# Patient Record
Sex: Female | Born: 1960 | Race: Black or African American | Hispanic: No | Marital: Married | Smoking: Never smoker
Health system: Southern US, Community
[De-identification: ages and names within clinical notes are randomized; demographics above are authoritative.]

## PROBLEM LIST (undated history)

## (undated) DIAGNOSIS — Z923 Personal history of irradiation: Secondary | ICD-10-CM

## (undated) DIAGNOSIS — C801 Malignant (primary) neoplasm, unspecified: Secondary | ICD-10-CM

## (undated) DIAGNOSIS — I10 Essential (primary) hypertension: Secondary | ICD-10-CM

## (undated) DIAGNOSIS — Z803 Family history of malignant neoplasm of breast: Secondary | ICD-10-CM

## (undated) DIAGNOSIS — Z1379 Encounter for other screening for genetic and chromosomal anomalies: Principal | ICD-10-CM

## (undated) HISTORY — PX: BREAST EXCISIONAL BIOPSY: SUR124

## (undated) HISTORY — DX: Encounter for other screening for genetic and chromosomal anomalies: Z13.79

## (undated) HISTORY — DX: Family history of malignant neoplasm of breast: Z80.3

## (undated) HISTORY — PX: BREAST LUMPECTOMY: SHX2

## (undated) HISTORY — PX: ABDOMINAL HYSTERECTOMY: SHX81

---

## 1997-07-27 ENCOUNTER — Ambulatory Visit (HOSPITAL_COMMUNITY): Admission: RE | Admit: 1997-07-27 | Discharge: 1997-07-27 | Payer: Self-pay | Admitting: *Deleted

## 1998-01-26 ENCOUNTER — Ambulatory Visit (HOSPITAL_COMMUNITY): Admission: RE | Admit: 1998-01-26 | Discharge: 1998-01-26 | Payer: Self-pay | Admitting: *Deleted

## 1998-02-20 ENCOUNTER — Other Ambulatory Visit: Admission: RE | Admit: 1998-02-20 | Discharge: 1998-02-20 | Payer: Self-pay | Admitting: Obstetrics and Gynecology

## 1998-07-27 ENCOUNTER — Ambulatory Visit (HOSPITAL_COMMUNITY): Admission: RE | Admit: 1998-07-27 | Discharge: 1998-07-27 | Payer: Self-pay | Admitting: *Deleted

## 1999-01-24 ENCOUNTER — Ambulatory Visit (HOSPITAL_COMMUNITY): Admission: RE | Admit: 1999-01-24 | Discharge: 1999-01-24 | Payer: Self-pay | Admitting: *Deleted

## 1999-02-27 ENCOUNTER — Other Ambulatory Visit: Admission: RE | Admit: 1999-02-27 | Discharge: 1999-02-27 | Payer: Self-pay | Admitting: Obstetrics and Gynecology

## 2000-08-04 ENCOUNTER — Encounter: Payer: Self-pay | Admitting: Obstetrics and Gynecology

## 2000-08-04 ENCOUNTER — Encounter: Admission: RE | Admit: 2000-08-04 | Discharge: 2000-08-04 | Payer: Self-pay | Admitting: Obstetrics and Gynecology

## 2001-07-22 ENCOUNTER — Other Ambulatory Visit: Admission: RE | Admit: 2001-07-22 | Discharge: 2001-07-22 | Payer: Self-pay | Admitting: Obstetrics and Gynecology

## 2001-08-26 ENCOUNTER — Encounter: Payer: Self-pay | Admitting: Obstetrics and Gynecology

## 2001-08-26 ENCOUNTER — Encounter: Admission: RE | Admit: 2001-08-26 | Discharge: 2001-08-26 | Payer: Self-pay | Admitting: Obstetrics and Gynecology

## 2001-09-07 ENCOUNTER — Encounter (INDEPENDENT_AMBULATORY_CARE_PROVIDER_SITE_OTHER): Payer: Self-pay | Admitting: Specialist

## 2001-09-07 ENCOUNTER — Ambulatory Visit (HOSPITAL_COMMUNITY): Admission: RE | Admit: 2001-09-07 | Discharge: 2001-09-07 | Payer: Self-pay | Admitting: Obstetrics and Gynecology

## 2002-08-30 ENCOUNTER — Encounter: Payer: Self-pay | Admitting: Obstetrics and Gynecology

## 2002-08-30 ENCOUNTER — Encounter: Admission: RE | Admit: 2002-08-30 | Discharge: 2002-08-30 | Payer: Self-pay | Admitting: Obstetrics and Gynecology

## 2002-09-07 ENCOUNTER — Other Ambulatory Visit: Admission: RE | Admit: 2002-09-07 | Discharge: 2002-09-07 | Payer: Self-pay | Admitting: Obstetrics and Gynecology

## 2003-08-31 ENCOUNTER — Encounter: Admission: RE | Admit: 2003-08-31 | Discharge: 2003-08-31 | Payer: Self-pay | Admitting: Family Medicine

## 2003-12-12 ENCOUNTER — Other Ambulatory Visit: Admission: RE | Admit: 2003-12-12 | Discharge: 2003-12-12 | Payer: Self-pay | Admitting: Obstetrics and Gynecology

## 2004-09-16 ENCOUNTER — Encounter: Admission: RE | Admit: 2004-09-16 | Discharge: 2004-09-16 | Payer: Self-pay | Admitting: Obstetrics and Gynecology

## 2005-01-16 ENCOUNTER — Other Ambulatory Visit: Admission: RE | Admit: 2005-01-16 | Discharge: 2005-01-16 | Payer: Self-pay | Admitting: Obstetrics and Gynecology

## 2005-09-18 ENCOUNTER — Encounter: Admission: RE | Admit: 2005-09-18 | Discharge: 2005-09-18 | Payer: Self-pay | Admitting: Obstetrics and Gynecology

## 2006-01-20 ENCOUNTER — Ambulatory Visit (HOSPITAL_COMMUNITY): Admission: RE | Admit: 2006-01-20 | Discharge: 2006-01-21 | Payer: Self-pay | Admitting: Obstetrics and Gynecology

## 2006-01-20 ENCOUNTER — Encounter (INDEPENDENT_AMBULATORY_CARE_PROVIDER_SITE_OTHER): Payer: Self-pay | Admitting: *Deleted

## 2006-10-01 ENCOUNTER — Encounter: Admission: RE | Admit: 2006-10-01 | Discharge: 2006-10-01 | Payer: Self-pay | Admitting: Family Medicine

## 2007-10-04 ENCOUNTER — Encounter: Admission: RE | Admit: 2007-10-04 | Discharge: 2007-10-04 | Payer: Self-pay | Admitting: Family Medicine

## 2008-10-04 ENCOUNTER — Encounter: Admission: RE | Admit: 2008-10-04 | Discharge: 2008-10-04 | Payer: Self-pay | Admitting: Obstetrics and Gynecology

## 2008-10-13 ENCOUNTER — Encounter: Admission: RE | Admit: 2008-10-13 | Discharge: 2008-10-13 | Payer: Self-pay | Admitting: Orthopedic Surgery

## 2009-10-05 ENCOUNTER — Encounter: Admission: RE | Admit: 2009-10-05 | Discharge: 2009-10-05 | Payer: Self-pay | Admitting: Family Medicine

## 2010-09-05 ENCOUNTER — Other Ambulatory Visit: Payer: Self-pay | Admitting: Family Medicine

## 2010-09-05 DIAGNOSIS — Z853 Personal history of malignant neoplasm of breast: Secondary | ICD-10-CM

## 2010-09-05 DIAGNOSIS — Z1231 Encounter for screening mammogram for malignant neoplasm of breast: Secondary | ICD-10-CM

## 2010-09-20 NOTE — H&P (Signed)
Destiny Barry, Destiny Barry             ACCOUNT NO.:  [id number]   MEDICAL RECORD NO.:  [id number]          PATIENT TYPE:  AMB   LOCATION:                                [facility]   PHYSICIAN:  [name], M.D.DATE OF BIRTH:  10-30-1960   DATE OF ADMISSION:  01/20/2006  DATE OF DISCHARGE:                                HISTORY & PHYSICAL   CHIEF COMPLAINT:  Symptomatic fibroids.   HISTORY OF PRESENT ILLNESS:  A 50 year old G2, P0, A2, currently using  condoms for contraceptions.  This patient has had increasing problems with  cramping and bleeding secondary to leiomyoma.  Presents now for definitive  hysterectomy.  She underwent D&C with hysteroscopy in 2003.  A submucous  fibroid was noted but was only partially resected.  She has continued to  contemplate whether or not to consider other treatment options.  It is not  controlled well with hormonal treatment.  Now has decided that she wants  definitive treatment in the form of hysterectomy.   Her most recent ultrasound in our office October 24, 2005, showed two fibroids,  5.3 and 4.1, adnexa unremarkable.  On attempts at saline instillation we did  not get good filling, but one of the fibroids appeared to be submucosal.  We  discussed treatment options.  She has had no history of abnormal Paps.  Last  Pap August 2006 was normal.  She would prefer to get back to work ASAP,  which is why we discussed the possibility of LSH.  She would also prefer to  leave her otherwise normal-appearing ovaries intact, which we discussed.  I  did review that if the fibroid obscure technically the ability to secure the  ascending branch of the uterine artery, she may require hysterectomy via  standard approach with a TAH, which she stands.   This procedure, including risks of bleeding, infection, adjacent organ  injury, transfusion, wound infection, phlebitis, along with her expected  recovery time, were all reviewed.  Again, her preference is  to leave her  otherwise normal-appearing ovaries unless pathology is encountered.   PAST MEDICAL HISTORY:  Allergies:  None.   Current medications:  Detrol LA.   REVIEW OF SYSTEMS:  Significant for smoking one-half PPD and a history of  anemia.  Otherwise negative.  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  This was by Dr. Luan Pulling in the mid-1990s.   FAMILY HISTORY:  Significant for hypertension and stroke.   PHYSICAL EXAMINATION:  VITAL SIGNS:  Temperature 98.2, blood pressure  110/80.  HEENT:  Unremarkable.  NECK:  Supple without masses.  LUNGS:  Clear.  CARDIOVASCULAR:  Regular rate and rhythm without murmurs, rubs, gallops  noted.  BREASTS:  Without masses.  ABDOMEN:  Soft, flat, nontender.  PELVIC:  Normal external genitalia.  Vagina and cervix clear.  Uterus  midposition, upper limit of normal size.  Adnexa negative.  Exam was  difficult due to her weight of about 250 pounds.  EXTREMITIES:  Unremarkable.  NEUROLOGIC:  Unremarkable.   IMPRESSION:  Symptomatic leiomyoma.   PLAN:  LSH, possible TAH.  Procedure and risks reviewed as above.      Richard M. Marcelle Overlie, [name]  Electronically Signed     RMH/MEDQ  D:  01/15/2006  T:  01/15/2006  Job:  [id number]

## 2010-09-20 NOTE — Op Note (Signed)
[facility]  Patient:    Destiny Barry, Destiny Barry Visit Number: [id number] MRN: [id number]          Service Type: DSU Location: [facility] Attending Physician:  Rhina Brackett Dictated by:   [name], [name] Proc. Date: 09/07/01 Admit Date:  09/07/2001                             Operative Report  PREOPERATIVE DIAGNOSES:       Abnormal uterine bleeding, leiomyoma.  POSTOPERATIVE DIAGNOSES:      Abnormal uterine bleeding, leiomyoma.  PROCEDURE:                    Dilatation and curettage, hysteroscopy.  SURGEON:                      [name], [name]  ANESTHESIA:                   General.  COMPLICATIONS:                None.  DRAINS:                       Foley catheter.  ESTIMATED BLOOD LOSS:         Less than 10 cc.  PROCEDURE AND FINDINGS:       Patient sent to the operating room.  After an adequate level of general anesthesia was obtained with the patients legs in stirrups, the perineum and vagina were prepped and draped in the usual manner for D&C.  The bladder was drained.  EUA carried out.  Uterus mid position, normal size.  Adnexa negative.  Speculum was positioned.  Cervix grasped with a tenaculum.  Was sounded to 8 cm, progressively dilated to a 29 Pratt.  Her sponge and laminaria were removed preoperatively.  The 7 mm continuous flow diagnostic hysteroscope was then inserted.  It was a difficult view due to what appeared to be distortion from intramural fibroids.  Scope was removed. D&C was carried out.  The cavity was irregularly shaped by palpation.  D&C was carried out and the scope was reinserted.  A better view the second time.  No submucous fibroids that were resectable were noted; however, there was some distortion of the cavity laterally on either side, although the fundus looked fairly normal.  After these findings were noted since resection was not possible, instruments were removed.  Several interrupted 2-0  chromic sutures placed on the cervical tenaculum site to control a small amount of bleeding. This was hemostatic.  She went to recovery room in good condition. Dictated by:   [name], [name] Attending Physician:  Rhina Brackett DD:  09/07/01 TD:  09/08/01 Job: [id number]

## 2010-09-20 NOTE — H&P (Signed)
[facility]  Patient:    Destiny Barry, Destiny Barry Visit Number: [id number] MRN: [id number]          Service Type: DSU Location: [facility] Attending Physician:  Rhina Brackett Dictated by:   [name], [name] Admit Date:  09/07/2001                           History and Physical  SCHEDULED SURGERY DATE:       Sep 07, 2001 at [facility].  CHIEF COMPLAINT:              Abnormal uterine bleeding.  HISTORY OF PRESENT ILLNESS:   This is a 50 year old, G2, P0, not currently using anything for contraception. On recent examination, sonohysterogram was done to evaluate her abnormal bleeding. Findings showed uterus 9.5 x 6 x 5.4 _______ 12.2 endometrium with several submucous appearing fibroids on scan. She presents at this time for D&C/hysteroscopy, possible resection of submucous fibroids. This procedure, including risk of bleeding, infection, transfusion, adjacent organ injury with possible need for open or additional surgery are all reviewed with her which she understands and accepts.  PAST MEDICAL HISTORY:  ALLERGIES:                    None.  OBSTETRICAL HISTORY:          TAB x 1.  MEDICAL HISTORY:              She has had early stage breast cancer with lumpectomy treated with postoperative RT.  REVIEW OF SYSTEMS:            Significant for smoking one-half ppd; otherwise negative.  PHYSICAL EXAMINATION:  VITAL SIGNS:                  Temperature 98.2, blood pressure 120/78.  HEENT:                        Unremarkable.  NECK:                         Supple without masses.  LUNGS:                        Clear.  CARDIOVASCULAR:               Regular rate and rhythm without murmurs, rubs, or gallops.  BREASTS:                      Without masses.  ABDOMEN:                      Soft, flat, nontender.  PELVIC:                       Normal external genitalia. Vagina and cervix clear. Uterus mid position, normal size. Adnexa  negative, although exam difficult due to her weight of 256.  IMPRESSION:                   Abnormal uterine bleeding, submucous fibroids noted on ultrasound.  PLAN:                         D&C/hysteroscopy with possible resection of submucous fibroids. Procedure and risks reviewed as above. Dictated by:   Duke Salvia.  Marcelle Overlie, [name] Attending Physician:  Rhina Brackett DD:  09/06/01 TD:  09/06/01 Job: [id number]

## 2010-09-20 NOTE — Op Note (Signed)
Destiny Barry, Destiny Barry             ACCOUNT NO.:  [id number]   MEDICAL RECORD NO.:  [id number]          PATIENT TYPE:  AMB   LOCATION:  SDC                           [facility]:  [facility]   PHYSICIAN:  [name], M.D.DATE OF BIRTH:  10-09-1960   DATE OF PROCEDURE:  01/20/2006  DATE OF DISCHARGE:                                 OPERATIVE REPORT   PREOPERATIVE DIAGNOSIS:  Symptomatic leiomyoma with menorrhagia.   POSTOPERATIVE DIAGNOSIS:  Symptomatic leiomyoma with menorrhagia.   PROCEDURE:  LSH.   SURGEON:  [name], [name]   ASSISTANT:  Guy Sandifer. Henderson Cloud, [name]   ANESTHESIA:  General endotracheal anesthesia.   COMPLICATIONS:  None.   DRAINS:  Foley catheter.   BLOOD LOSS:  100 mL.   PROCEDURE AND FINDINGS:  The patient was taken to the operating room and  after an adequate level of general endotracheal anesthesia was obtained with  the patient's legs in stirrups, the abdomen, perineum and vagina were  prepped and draped with Betadine.  A Foley catheter was positioned draining  clear urine.  The subumbilical area was infiltrated with 0.5% Marcaine  plain.  A small incision was made.  The Veress needle was introduced without  difficulty.  Its intra-abdominal position was verified by pressure and water  testing.  After a 3 liters pneumoperitoneum was created, the laparoscopic  trocar and sleeve were then introduced without difficulty.  An atraumatic 10  mm trocar was then placed in the left lower quadrant after negative  transillumination and infiltration of the skin with 0.5% Marcaine plain.  This was done under direct visualization.  The patient was then placed in  Trendelenburg. The anterior and posterior cul-de-sac spaces were free and  clear. The rest of the upper abdominal exam was unremarkable.  Her uterus  was 8-10 weeks size, symmetrically enlarged with known fibroids, especially  posteriorly, but there was good mobility.  The system placed the uterus on  traction and the left utero-ovarian pedicle was coagulated and divided with  the Ace harmonic scalpel down to including the round ligament.  The  peritoneal leaf was then cleared across to the midline.  The exact same  repeated on the opposite side with excellent hemostasis.  Thus, both normal  ovaries were conserved.   The anterior perineum was dissected with some minimal sharp and blunt  dissection to displace it slightly posteriorly.  The ascending branch of the  uterine artery on either side was then multiply coagulated and divided on  each side with excellent hemostasis and blanching of the fundus.  The  harmonic Ace was then used to divide the fundus.  The endocervical canal was  coagulated with the harmonic Ace on low power.  The Morcellator was then  positioned at the left lower quadrant and the specimen was morcellated.  Small pieces were removed.  Careful inspection remove all fragments were  removed.  The pelvis was irrigated with saline and inspected. The operative  site was noted to be  hemostatic.  Interceed was placed across the endocervical stump.  The  instruments were removed, gas allowed to escape.  The deep fascia was closed  with 4-0 Dexon subcuticular sutures and the left lower fascia was closed  with a 2-0 Vicryl suture.  She tolerated this well and went to the recovery  room in good condition.      Richard M. Marcelle Overlie, [name]  Electronically Signed     RMH/MEDQ  D:  01/20/2006  T:  01/20/2006  Job:  [id number]

## 2010-09-20 NOTE — Discharge Summary (Signed)
NAMEJAKAYLA, Destiny Barry             ACCOUNT NO.:  [id number]   MEDICAL RECORD NO.:  [id number]          PATIENT TYPE:  OIB   LOCATION:  9306                          [facility]ILITY:  [facility]   PHYSICIAN:  [name], M.D.DATE OF BIRTH:  25-Oct-1960   DATE OF ADMISSION:  01/20/2006  DATE OF DISCHARGE:  01/21/2006                                 DISCHARGE SUMMARY   DISCHARGE DIAGNOSES:  1. Symptomatic leiomyoma with menorrhagia.  2. Laparoscopic supracervical hysterectomy this admission.   SUMMARY OF THE HISTORY AND PHYSICAL EXAM:  Please see admission H&P for  details.  Briefly, a 50 year old with symptomatic leiomyoma presents for  [facility].   HOSPITAL COURSE:  On September 18, under general anesthesia, the patient  underwent a LSH.  Both ovaries, which were normal, were conserved.  Catheter  was removed that evening; she was voiding without difficulty the following  a.m., was tolerating a regular diet and was ambulating without difficulty,  afebrile.  The incisions were clean and dry, and she was ready for discharge  at that point.  Preop hemoglobin 10.4, postop 9.1.   OTHER LABORATORY DATA:  B positive, antibody screen negative.  CMET normal.  Admission CBC normal, except for hemoglobin of 10.4, hematocrit 32.5 and  platelets 446,000.   DISPOSITION:  The patient is discharged on Tylox p.r.n. pain, will take  Feosol OTC iron once daily.  Will return to the office in 1 week, advised to  report any incisional redness or drainage, increased pain or bleeding, or  fever over 101.  She was given specific instructions regarding diet, sex and  exercise.   CONDITION:  Good.   ACTIVITY:  Gradually increase.      Richard M. Marcelle Overlie, [name]  Electronically Signed     RMH/MEDQ  D:  01/21/2006  T:  01/22/2006  Job:  [id number]

## 2010-10-08 ENCOUNTER — Ambulatory Visit
Admission: RE | Admit: 2010-10-08 | Discharge: 2010-10-08 | Disposition: A | Discharge status: Home / Self Care | Payer: 59 | Source: Ambulatory Visit | Attending: Family Medicine | Admitting: Family Medicine

## 2010-10-08 DIAGNOSIS — Z1231 Encounter for screening mammogram for malignant neoplasm of breast: Secondary | ICD-10-CM

## 2010-10-08 DIAGNOSIS — Z853 Personal history of malignant neoplasm of breast: Secondary | ICD-10-CM

## 2010-10-24 ENCOUNTER — Other Ambulatory Visit: Payer: Self-pay | Admitting: Family Medicine

## 2010-10-24 DIAGNOSIS — Z853 Personal history of malignant neoplasm of breast: Secondary | ICD-10-CM

## 2010-11-13 ENCOUNTER — Ambulatory Visit
Admission: RE | Admit: 2010-11-13 | Discharge: 2010-11-13 | Disposition: A | Discharge status: Home / Self Care | Payer: 59 | Source: Ambulatory Visit | Attending: Family Medicine | Admitting: Family Medicine

## 2010-11-13 DIAGNOSIS — Z853 Personal history of malignant neoplasm of breast: Secondary | ICD-10-CM

## 2010-11-13 MED ORDER — GADOBENATE DIMEGLUMINE 529 MG/ML IV SOLN
20.0000 mL | Freq: Once | INTRAVENOUS | Status: AC | PRN
  Administered 2010-11-13: 20 mL via INTRAVENOUS

## 2011-09-18 ENCOUNTER — Other Ambulatory Visit: Payer: Self-pay | Admitting: Family Medicine

## 2011-09-18 ENCOUNTER — Other Ambulatory Visit: Payer: Self-pay | Admitting: Gastroenterology

## 2011-09-18 DIAGNOSIS — Z1231 Encounter for screening mammogram for malignant neoplasm of breast: Secondary | ICD-10-CM

## 2011-11-14 ENCOUNTER — Ambulatory Visit
Admission: RE | Admit: 2011-11-14 | Discharge: 2011-11-14 | Disposition: A | Discharge status: Home / Self Care | Payer: BC Managed Care – PPO | Source: Ambulatory Visit | Attending: Family Medicine | Admitting: Family Medicine

## 2011-11-14 DIAGNOSIS — Z1231 Encounter for screening mammogram for malignant neoplasm of breast: Secondary | ICD-10-CM

## 2012-10-18 ENCOUNTER — Other Ambulatory Visit: Payer: Self-pay

## 2012-10-18 DIAGNOSIS — Z1231 Encounter for screening mammogram for malignant neoplasm of breast: Secondary | ICD-10-CM

## 2012-11-18 ENCOUNTER — Ambulatory Visit
Admission: RE | Admit: 2012-11-18 | Discharge: 2012-11-18 | Disposition: A | Discharge status: Home / Self Care | Payer: BC Managed Care – PPO | Source: Ambulatory Visit

## 2012-11-18 DIAGNOSIS — Z1231 Encounter for screening mammogram for malignant neoplasm of breast: Secondary | ICD-10-CM

## 2013-10-20 ENCOUNTER — Other Ambulatory Visit: Payer: Self-pay

## 2013-10-20 DIAGNOSIS — Z1231 Encounter for screening mammogram for malignant neoplasm of breast: Secondary | ICD-10-CM

## 2013-11-22 ENCOUNTER — Ambulatory Visit: Payer: BC Managed Care – PPO

## 2013-11-25 ENCOUNTER — Ambulatory Visit
Admission: RE | Admit: 2013-11-25 | Discharge: 2013-11-25 | Disposition: A | Discharge status: Home / Self Care | Payer: BC Managed Care – PPO | Source: Ambulatory Visit

## 2013-11-25 DIAGNOSIS — Z1231 Encounter for screening mammogram for malignant neoplasm of breast: Secondary | ICD-10-CM

## 2014-10-27 ENCOUNTER — Other Ambulatory Visit: Payer: Self-pay

## 2014-10-27 DIAGNOSIS — Z1231 Encounter for screening mammogram for malignant neoplasm of breast: Secondary | ICD-10-CM

## 2014-12-01 ENCOUNTER — Ambulatory Visit
Admission: RE | Admit: 2014-12-01 | Discharge: 2014-12-01 | Disposition: A | Discharge status: Home / Self Care | Payer: BLUE CROSS/BLUE SHIELD | Source: Ambulatory Visit

## 2014-12-01 DIAGNOSIS — Z1231 Encounter for screening mammogram for malignant neoplasm of breast: Secondary | ICD-10-CM

## 2015-09-06 DIAGNOSIS — E782 Mixed hyperlipidemia: Secondary | ICD-10-CM | POA: Diagnosis not present

## 2015-09-06 DIAGNOSIS — E559 Vitamin D deficiency, unspecified: Secondary | ICD-10-CM | POA: Diagnosis not present

## 2015-09-06 DIAGNOSIS — Z0000 Encounter for general adult medical examination without abnormal findings: Secondary | ICD-10-CM | POA: Diagnosis not present

## 2015-09-12 DIAGNOSIS — B309 Viral conjunctivitis, unspecified: Secondary | ICD-10-CM | POA: Diagnosis not present

## 2015-10-28 DIAGNOSIS — H5711 Ocular pain, right eye: Secondary | ICD-10-CM | POA: Diagnosis not present

## 2015-11-26 ENCOUNTER — Other Ambulatory Visit: Payer: Self-pay | Admitting: Family Medicine

## 2015-11-26 DIAGNOSIS — Z139 Encounter for screening, unspecified: Secondary | ICD-10-CM

## 2015-12-05 ENCOUNTER — Ambulatory Visit
Admission: RE | Admit: 2015-12-05 | Discharge: 2015-12-05 | Disposition: A | Discharge status: Home / Self Care | Payer: BLUE CROSS/BLUE SHIELD | Source: Ambulatory Visit | Attending: Family Medicine | Admitting: Family Medicine

## 2015-12-05 DIAGNOSIS — Z1231 Encounter for screening mammogram for malignant neoplasm of breast: Secondary | ICD-10-CM | POA: Diagnosis not present

## 2015-12-05 DIAGNOSIS — Z139 Encounter for screening, unspecified: Secondary | ICD-10-CM

## 2016-09-26 DIAGNOSIS — E782 Mixed hyperlipidemia: Secondary | ICD-10-CM | POA: Diagnosis not present

## 2016-09-26 DIAGNOSIS — E559 Vitamin D deficiency, unspecified: Secondary | ICD-10-CM | POA: Diagnosis not present

## 2016-09-26 DIAGNOSIS — Z0000 Encounter for general adult medical examination without abnormal findings: Secondary | ICD-10-CM | POA: Diagnosis not present

## 2016-11-07 ENCOUNTER — Other Ambulatory Visit: Payer: Self-pay | Admitting: Family Medicine

## 2016-11-07 DIAGNOSIS — Z1231 Encounter for screening mammogram for malignant neoplasm of breast: Secondary | ICD-10-CM

## 2016-12-08 ENCOUNTER — Ambulatory Visit
Admission: RE | Admit: 2016-12-08 | Discharge: 2016-12-08 | Disposition: A | Discharge status: Home / Self Care | Payer: PRIVATE HEALTH INSURANCE | Source: Ambulatory Visit | Attending: Family Medicine | Admitting: Family Medicine

## 2016-12-08 DIAGNOSIS — Z1231 Encounter for screening mammogram for malignant neoplasm of breast: Secondary | ICD-10-CM

## 2016-12-08 HISTORY — DX: Personal history of irradiation: Z92.3

## 2016-12-09 ENCOUNTER — Other Ambulatory Visit: Payer: Self-pay | Admitting: Family Medicine

## 2016-12-09 DIAGNOSIS — R928 Other abnormal and inconclusive findings on diagnostic imaging of breast: Secondary | ICD-10-CM

## 2016-12-16 ENCOUNTER — Other Ambulatory Visit: Payer: Self-pay | Admitting: Family Medicine

## 2016-12-16 ENCOUNTER — Ambulatory Visit
Admission: RE | Admit: 2016-12-16 | Discharge: 2016-12-16 | Disposition: A | Discharge status: Home / Self Care | Payer: BLUE CROSS/BLUE SHIELD | Source: Ambulatory Visit | Attending: Family Medicine | Admitting: Family Medicine

## 2016-12-16 DIAGNOSIS — R921 Mammographic calcification found on diagnostic imaging of breast: Secondary | ICD-10-CM

## 2016-12-16 DIAGNOSIS — R928 Other abnormal and inconclusive findings on diagnostic imaging of breast: Secondary | ICD-10-CM

## 2016-12-16 DIAGNOSIS — R922 Inconclusive mammogram: Secondary | ICD-10-CM | POA: Diagnosis not present

## 2016-12-17 ENCOUNTER — Ambulatory Visit
Admission: RE | Admit: 2016-12-17 | Discharge: 2016-12-17 | Disposition: A | Discharge status: Home / Self Care | Payer: BLUE CROSS/BLUE SHIELD | Source: Ambulatory Visit | Attending: Family Medicine | Admitting: Family Medicine

## 2016-12-17 DIAGNOSIS — R921 Mammographic calcification found on diagnostic imaging of breast: Secondary | ICD-10-CM

## 2016-12-17 DIAGNOSIS — N6011 Diffuse cystic mastopathy of right breast: Secondary | ICD-10-CM | POA: Diagnosis not present

## 2016-12-26 DIAGNOSIS — Z86010 Personal history of colonic polyps: Secondary | ICD-10-CM | POA: Diagnosis not present

## 2016-12-26 DIAGNOSIS — K640 First degree hemorrhoids: Secondary | ICD-10-CM | POA: Diagnosis not present

## 2016-12-26 DIAGNOSIS — K635 Polyp of colon: Secondary | ICD-10-CM | POA: Diagnosis not present

## 2016-12-30 DIAGNOSIS — K635 Polyp of colon: Secondary | ICD-10-CM | POA: Diagnosis not present

## 2017-01-09 ENCOUNTER — Other Ambulatory Visit: Payer: Self-pay | Admitting: General Surgery

## 2017-01-09 DIAGNOSIS — N6091 Unspecified benign mammary dysplasia of right breast: Secondary | ICD-10-CM

## 2017-01-19 ENCOUNTER — Encounter: Payer: Self-pay | Admitting: Genetic Counselor

## 2017-01-19 ENCOUNTER — Telehealth: Payer: Self-pay | Admitting: Genetic Counselor

## 2017-01-19 NOTE — Telephone Encounter (Signed)
Genetic counseling appt has been scheduled for the pt to see Ofri on 9/27 at 8am. Pt aware to arrive 15 minute early. Letter mailed.

## 2017-01-29 ENCOUNTER — Other Ambulatory Visit: Payer: BLUE CROSS/BLUE SHIELD

## 2017-01-29 ENCOUNTER — Ambulatory Visit (HOSPITAL_BASED_OUTPATIENT_CLINIC_OR_DEPARTMENT_OTHER): Payer: BLUE CROSS/BLUE SHIELD | Admitting: Genetic Counselor

## 2017-01-29 ENCOUNTER — Encounter: Payer: Self-pay | Admitting: Genetic Counselor

## 2017-01-29 DIAGNOSIS — N6011 Diffuse cystic mastopathy of right breast: Secondary | ICD-10-CM | POA: Diagnosis not present

## 2017-01-29 DIAGNOSIS — Z86000 Personal history of in-situ neoplasm of breast: Secondary | ICD-10-CM | POA: Diagnosis not present

## 2017-01-29 DIAGNOSIS — Z853 Personal history of malignant neoplasm of breast: Secondary | ICD-10-CM | POA: Diagnosis not present

## 2017-01-29 DIAGNOSIS — Z803 Family history of malignant neoplasm of breast: Secondary | ICD-10-CM

## 2017-01-29 DIAGNOSIS — Z1379 Encounter for other screening for genetic and chromosomal anomalies: Secondary | ICD-10-CM

## 2017-01-29 HISTORY — DX: Encounter for other screening for genetic and chromosomal anomalies: Z13.79

## 2017-01-29 NOTE — Progress Notes (Signed)
[facility]      Initial Visit   Patient Name: Destiny Barry Patient DOB: 02/03/1961 Patient Age: 56 y.o. Encounter Date: 01/29/2017  Referring Provider: Rolm Bookbinder, MD  Primary Care Provider: Hulan Fess, MD  Reason for Visit: Evaluate for hereditary susceptibility to cancer    Assessment and Plan:  . Destiny Barry's history of breast cancer at age 52 is not highly suggestive of a hereditary predisposition to cancer given her very large family and many unaffected female relatives. However, she meets NCCN criteria for genetic testing due to her young age at diagnosis.   . Testing is recommended to determine whether she has a pathogenic mutation that will impact her screening and risk-reduction for cancer. A negative result will be reassuring.  . Destiny Barry wished to pursue genetic testing and a blood sample will be sent for analysis of the 46 genes on [facility]'s Common Cancers panel (APC, ATM, AXIN2, BARD1, BMPR1A, BRCA1, BRCA2, BRIP1, CDH1, CDKN2A, CHEK2, CTNNA1, DICER1, EPCAM, GREM1, HOXB13, KIT, MEN1, MLH1, MSH2, MSH3, MSH6, MUTYH, NBN, NF1, NTHL1, PALB2, PDGFRA, PMS2, POLD1, POLE, PTEN, RAD50, RAD51C, RAD51D, SDHA, SDHB, SDHC, SDHD, SMAD4, SMARCA4, STK11, TP53, TSC1, TSC2, VHL).   . Results should be available in approximately 2-4 weeks, at which point we will contact her and address implications for her as well as address genetic testing for at-risk family members, if needed.     Dr. Jana Hakim was available for questions concerning this case. Total time spent by me in face-to-face counseling was approximately 25 minutes.   _____________________________________________________________________   History of Present Illness: Destiny Barry, a 56 y.o. female, is being seen at the [facility] due to a personal and family history of breast cancer. She presents to clinic today to discuss the possibility of a  hereditary predisposition to cancer and discuss whether genetic testing is warranted.  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 She is s/p lumpectomy and radiation.  She was recently found to have ADH on the right side and is scheduled for surgery on 02/16/17.  She reports having a hysterectomy due to fibroids some time after her breast cancer diagnosis, but does not recall the exact timing. Her ovaries remain intact.  She reports a history of 2 polyps (type unknown), with the most recent colonoscopy just two weeks ago. She was recommended to return in 10 years for another screening.   Past Medical History:  Diagnosis Date  . Family history of breast cancer   . Personal history of radiation therapy    age 16; rt breast    Past Surgical History:  Procedure Laterality Date  . BREAST LUMPECTOMY Right     Social History   Social History  . Marital status: Married    Spouse name: N/A  . Number of children: N/A  . Years of education: N/A   Social History Main Topics  . Smoking status: Not on file  . Smokeless tobacco: Not on file  . Alcohol use Not on file  . Drug use: Unknown  . Sexual activity: Not on file   Other Topics Concern  . Not on file   Social History Narrative  . No narrative on file     Family History:  During the visit, a 4-generation pedigree was obtained. Family tree will be scanned in the Media tab in Epic  Significant diagnoses include the following:  Family  History  Problem Relation Age of Onset  . Breast cancer Sister 59       Leukemia at 4; currently 43  . Melanoma Father        dx 73s on leg; currently 80  . Colon cancer Maternal Aunt        dx 4s; currently 90s  . Colon cancer Maternal Uncle        dx 42s; deceased 56s  . Colon cancer Cousin        son of mat uncle with CRC; dx 58s    Additionally, Destiny Barry has no children. She has a total of 4 sisters and a brother. Her father (age 50) has 59  siblings. Her mother (age 54) has 7 siblings.  Destiny Barry's ancestry is African American. There is no known Jewish ancestry and no consanguinity.  Discussion: We reviewed the characteristics, features and inheritance patterns of hereditary cancer syndromes. We discussed her risk of harboring a mutation in the context of her personal and family history. We discussed the process of genetic testing, insurance coverage and implications of results: positive, negative and variant of unknown significance (VUS).    [name] questions were answered to her satisfaction today and she is welcome to call with any additional questions or concerns. Thank you for the referral and allowing [place] to share in the care of your patient.    Steele Berg, MS, [place] Certified Genetic Counselor phone: [phone] [email or web address]   ______________________________________________________________________ For Office Staff:  Number of people involved in session: 1 Was an Intern/ student involved with case: yes

## 2017-02-09 ENCOUNTER — Encounter (HOSPITAL_BASED_OUTPATIENT_CLINIC_OR_DEPARTMENT_OTHER): Payer: Self-pay | Admitting: *Deleted

## 2017-02-09 ENCOUNTER — Other Ambulatory Visit: Payer: Self-pay

## 2017-02-09 ENCOUNTER — Encounter (HOSPITAL_BASED_OUTPATIENT_CLINIC_OR_DEPARTMENT_OTHER)
Admission: RE | Admit: 2017-02-09 | Discharge: 2017-02-09 | Disposition: A | Discharge status: Home / Self Care | Payer: BLUE CROSS/BLUE SHIELD | Source: Ambulatory Visit | Attending: General Surgery | Admitting: General Surgery

## 2017-02-09 DIAGNOSIS — Z01810 Encounter for preprocedural cardiovascular examination: Secondary | ICD-10-CM | POA: Diagnosis not present

## 2017-02-09 DIAGNOSIS — I10 Essential (primary) hypertension: Secondary | ICD-10-CM | POA: Insufficient documentation

## 2017-02-09 NOTE — Progress Notes (Signed)
Dr. Annye Asa reviewed EKG - ok for surgery. Pt given Ensure drink to drink by 0400 day of surgery with teach back method.

## 2017-02-12 ENCOUNTER — Encounter: Payer: Self-pay | Admitting: Genetic Counselor

## 2017-02-12 ENCOUNTER — Ambulatory Visit: Payer: Self-pay | Admitting: Genetic Counselor

## 2017-02-12 DIAGNOSIS — Z1379 Encounter for other screening for genetic and chromosomal anomalies: Secondary | ICD-10-CM

## 2017-02-12 NOTE — Progress Notes (Signed)
[facility]etics Clinic       Genetic Test Results    Patient Name: Destiny Barry Patient DOB: [birth year] Patient Age: 56 y.o. Encounter Date: 02/12/2017  Referring Provider: Rolm Bookbinder, MD  Primary Care Provider: Hulan Fess, MD   Destiny Barry was called today to discuss genetic test results. Please see the Genetics note from her visit on 01/29/17 for a detailed discussion of her personal and family history.  Genetic Testing: At the time of Destiny Barry's visit, she decided to pursue genetic testing of multiple genes associated with hereditary susceptibility to cancer. Testing included sequencing and deletion/duplication analysis. Testing did not reveal any pathogenic mutation in any of these genes.  A copy of the genetic test report will be scanned into Epic under the media tab.  The genes analyzed were the 46 genes on [facility]'s Common Cancers panel (APC, ATM, AXIN2, BARD1, BMPR1A, BRCA1, BRCA2, BRIP1, CDH1, CDKN2A, CHEK2, CTNNA1, DICER1, EPCAM, GREM1, HOXB13, KIT, MEN1, MLH1, MSH2, MSH3, MSH6, MUTYH, NBN, NF1, NTHL1, PALB2, PDGFRA, PMS2, POLD1, POLE, PTEN, RAD50, RAD51C, RAD51D, SDHA, SDHB, SDHC, SDHD, SMAD4, SMARCA4, STK11, TP53, TSC1, TSC2, VHL).  Since the current test is not perfect, it is possible that there may be a gene mutation that current testing cannot detect, but that chance is small. It is possible that a different genetic factor, which has not yet been discovered or is not on this panel, is responsible for the cancer diagnoses in the family. Again, the likelihood of this is low. No additional testing is recommended at this time for Destiny Barry.  A Variant of Uncertain Significance was detected: POLD1 [id number] (p.Gly271Arg). This is still considered a normal result. While at this time, it is unknown if this finding is associated with increased cancer risk or not, the majority of these variants get reclassified to be  inconsequential. We emphasized that medical management should not be based on this finding. With time, we suspect the lab will determine the significance, if any. If we do learn more about it, we will try to contact Destiny Barry to discuss it further. It is important to stay in touch with [place] periodically and keep the address and phone number up to date.  Cancer Screening: These results suggest that Destiny Barry's cancer was most likely not due to an inherited predisposition. Most cancers happen by chance and this test, along with details of her family history, suggests that her cancer falls into this category. We discussed continuing to follow the cancer screening guidelines provided by her physician.   Family Members: Given the young age of breast cancer in the family, family members are recommended to speak with their own providers about appropriate cancer screenings.  Any relative who had cancer at a young age or had a particularly rare cancer may also wish to pursue genetic testing. Genetic counselors can be located in other cities, by visiting the website of the [employer] (ArtistMovie.se) and Field seismologist for a Dietitian by zip code. Family members are not recommended to get tested for the above VUS outside of a research protocol as this finding has no implications for their medical management.  Lastly, cancer genetics is a rapidly advancing field and it is possible that new genetic tests will be appropriate for Destiny Barry in the future. We encourage her to remain in contact with [place] on an annual basis so we can update her personal  and family histories, and let her know of advances in cancer genetics that may benefit the family. Our contact number was provided. Destiny Barry is welcome to call anytime with additional questions.     Steele Berg, MS, [place] Certified Genetic Counselor phone: [phone]

## 2017-02-13 ENCOUNTER — Ambulatory Visit
Admission: RE | Admit: 2017-02-13 | Discharge: 2017-02-13 | Disposition: A | Discharge status: Home / Self Care | Payer: BLUE CROSS/BLUE SHIELD | Source: Ambulatory Visit | Attending: General Surgery | Admitting: General Surgery

## 2017-02-13 DIAGNOSIS — R921 Mammographic calcification found on diagnostic imaging of breast: Secondary | ICD-10-CM | POA: Diagnosis not present

## 2017-02-13 DIAGNOSIS — N6091 Unspecified benign mammary dysplasia of right breast: Secondary | ICD-10-CM

## 2017-02-15 NOTE — Anesthesia Preprocedure Evaluation (Signed)
Anesthesia Evaluation  Patient identified by MRN, date of birth, ID band Patient awake    Reviewed: Allergy & Precautions, NPO status , Patient's Chart, lab work & pertinent test results  Airway Mallampati: II  TM Distance: >3 FB Neck ROM: Full    Dental no notable dental hx.    Pulmonary neg pulmonary ROS,    Pulmonary exam normal breath sounds clear to auscultation       Cardiovascular hypertension, Pt. on medications negative cardio ROS Normal cardiovascular exam Rhythm:Regular Rate:Normal     Neuro/Psych negative neurological ROS  negative psych ROS   GI/Hepatic negative GI ROS, Neg liver ROS,   Endo/Other  negative endocrine ROSMorbid obesity  Renal/GU negative Renal ROS  negative genitourinary   Musculoskeletal negative musculoskeletal ROS (+)   Abdominal   Peds negative pediatric ROS (+)  Hematology negative hematology ROS (+)   Anesthesia Other Findings   Reproductive/Obstetrics negative OB ROS                            Anesthesia Physical Anesthesia Plan  ASA: II  Anesthesia Plan: General   Post-op Pain Management:    Induction: Intravenous  PONV Risk Score and Plan: 3 and Ondansetron, Dexamethasone, Midazolam, Treatment may vary due to age or medical condition and Scopolamine patch - Pre-op  Airway Management Planned: Oral ETT and LMA  Additional Equipment:   Intra-op Plan:   Post-operative Plan: Extubation in OR  Informed Consent:   Plan Discussed with:   Anesthesia Plan Comments: (  )        Anesthesia Quick Evaluation

## 2017-02-16 ENCOUNTER — Ambulatory Visit (HOSPITAL_BASED_OUTPATIENT_CLINIC_OR_DEPARTMENT_OTHER)
Admission: RE | Admit: 2017-02-16 | Discharge: 2017-02-16 | Disposition: A | Discharge status: Home / Self Care | Payer: BLUE CROSS/BLUE SHIELD | Source: Ambulatory Visit | Attending: General Surgery | Admitting: General Surgery

## 2017-02-16 ENCOUNTER — Encounter (HOSPITAL_BASED_OUTPATIENT_CLINIC_OR_DEPARTMENT_OTHER): Payer: Self-pay

## 2017-02-16 ENCOUNTER — Ambulatory Visit
Admission: RE | Admit: 2017-02-16 | Discharge: 2017-02-16 | Disposition: A | Discharge status: Home / Self Care | Payer: BLUE CROSS/BLUE SHIELD | Source: Ambulatory Visit | Attending: General Surgery | Admitting: General Surgery

## 2017-02-16 ENCOUNTER — Ambulatory Visit (HOSPITAL_BASED_OUTPATIENT_CLINIC_OR_DEPARTMENT_OTHER): Payer: BLUE CROSS/BLUE SHIELD | Admitting: Anesthesiology

## 2017-02-16 ENCOUNTER — Encounter (HOSPITAL_BASED_OUTPATIENT_CLINIC_OR_DEPARTMENT_OTHER)
Admission: RE | Disposition: A | Discharge status: Home / Self Care | Payer: Self-pay | Source: Ambulatory Visit | Attending: General Surgery

## 2017-02-16 DIAGNOSIS — R928 Other abnormal and inconclusive findings on diagnostic imaging of breast: Secondary | ICD-10-CM | POA: Diagnosis not present

## 2017-02-16 DIAGNOSIS — D0511 Intraductal carcinoma in situ of right breast: Secondary | ICD-10-CM | POA: Diagnosis not present

## 2017-02-16 DIAGNOSIS — Z79899 Other long term (current) drug therapy: Secondary | ICD-10-CM | POA: Insufficient documentation

## 2017-02-16 DIAGNOSIS — Z803 Family history of malignant neoplasm of breast: Secondary | ICD-10-CM | POA: Insufficient documentation

## 2017-02-16 DIAGNOSIS — I10 Essential (primary) hypertension: Secondary | ICD-10-CM | POA: Insufficient documentation

## 2017-02-16 DIAGNOSIS — R921 Mammographic calcification found on diagnostic imaging of breast: Secondary | ICD-10-CM | POA: Diagnosis not present

## 2017-02-16 DIAGNOSIS — Z853 Personal history of malignant neoplasm of breast: Secondary | ICD-10-CM | POA: Insufficient documentation

## 2017-02-16 DIAGNOSIS — Z6841 Body Mass Index (BMI) 40.0 and over, adult: Secondary | ICD-10-CM | POA: Insufficient documentation

## 2017-02-16 DIAGNOSIS — N6310 Unspecified lump in the right breast, unspecified quadrant: Secondary | ICD-10-CM | POA: Diagnosis not present

## 2017-02-16 DIAGNOSIS — Z923 Personal history of irradiation: Secondary | ICD-10-CM | POA: Diagnosis not present

## 2017-02-16 DIAGNOSIS — N6489 Other specified disorders of breast: Secondary | ICD-10-CM | POA: Diagnosis not present

## 2017-02-16 HISTORY — DX: Essential (primary) hypertension: I10

## 2017-02-16 HISTORY — PX: RADIOACTIVE SEED GUIDED EXCISIONAL BREAST BIOPSY: SHX6490

## 2017-02-16 HISTORY — DX: Malignant (primary) neoplasm, unspecified: C80.1

## 2017-02-16 SURGERY — RADIOACTIVE SEED GUIDED BREAST BIOPSY
Anesthesia: General | Site: Breast | Laterality: Right

## 2017-02-16 MED ORDER — BUPIVACAINE HCL (PF) 0.25 % IJ SOLN
INTRAMUSCULAR | Status: DC | PRN
  Administered 2017-02-16: 10 mL

## 2017-02-16 MED ORDER — PHENYLEPHRINE HCL 10 MG/ML IJ SOLN
INTRAMUSCULAR | Status: DC | PRN
  Administered 2017-02-16: 80 mg via INTRAVENOUS

## 2017-02-16 MED ORDER — MEPERIDINE HCL 25 MG/ML IJ SOLN: 6.2500 mg | INTRAMUSCULAR | Status: DC | PRN

## 2017-02-16 MED ORDER — GABAPENTIN 300 MG PO CAPS
ORAL_CAPSULE | ORAL | Status: AC
Start: 2017-02-16 — End: ?
  Filled 2017-02-16: qty 1

## 2017-02-16 MED ORDER — ONDANSETRON HCL 4 MG/2ML IJ SOLN
INTRAMUSCULAR | Status: AC
  Filled 2017-02-16: qty 2

## 2017-02-16 MED ORDER — LIDOCAINE 2% (20 MG/ML) 5 ML SYRINGE
INTRAMUSCULAR | Status: AC
  Filled 2017-02-16: qty 5

## 2017-02-16 MED ORDER — DEXAMETHASONE SODIUM PHOSPHATE 4 MG/ML IJ SOLN
INTRAMUSCULAR | Status: DC | PRN
  Administered 2017-02-16: 10 mg via INTRAVENOUS

## 2017-02-16 MED ORDER — CELECOXIB 200 MG PO CAPS
ORAL_CAPSULE | ORAL | Status: AC
  Filled 2017-02-16: qty 1

## 2017-02-16 MED ORDER — BUPIVACAINE HCL (PF) 0.25 % IJ SOLN
INTRAMUSCULAR | Status: AC
  Filled 2017-02-16: qty 30

## 2017-02-16 MED ORDER — FENTANYL CITRATE (PF) 100 MCG/2ML IJ SOLN
INTRAMUSCULAR | Status: AC
  Filled 2017-02-16: qty 2

## 2017-02-16 MED ORDER — FENTANYL CITRATE (PF) 100 MCG/2ML IJ SOLN: 25.0000 ug | INTRAMUSCULAR | Status: DC | PRN

## 2017-02-16 MED ORDER — TRAMADOL HCL 50 MG PO TABS: 50.0000 mg | ORAL_TABLET | Freq: Four times a day (QID) | ORAL | 1 refills | Status: DC | PRN

## 2017-02-16 MED ORDER — CEFAZOLIN SODIUM-DEXTROSE 2-4 GM/100ML-% IV SOLN
2.0000 g | INTRAVENOUS | Status: AC
  Administered 2017-02-16: 2 g via INTRAVENOUS

## 2017-02-16 MED ORDER — SCOPOLAMINE 1 MG/3DAYS TD PT72: 1.0000 | MEDICATED_PATCH | Freq: Once | TRANSDERMAL | Status: DC | PRN

## 2017-02-16 MED ORDER — PROPOFOL 10 MG/ML IV BOLUS
INTRAVENOUS | Status: DC | PRN
  Administered 2017-02-16: 150 mg via INTRAVENOUS

## 2017-02-16 MED ORDER — GABAPENTIN 300 MG PO CAPS
300.0000 mg | ORAL_CAPSULE | ORAL | Status: AC
  Administered 2017-02-16: 300 mg via ORAL

## 2017-02-16 MED ORDER — FENTANYL CITRATE (PF) 100 MCG/2ML IJ SOLN
50.0000 ug | INTRAMUSCULAR | Status: DC | PRN
  Administered 2017-02-16: 100 ug via INTRAVENOUS

## 2017-02-16 MED ORDER — ONDANSETRON HCL 4 MG/2ML IJ SOLN
INTRAMUSCULAR | Status: DC | PRN
  Administered 2017-02-16 (×2): 4 mg via INTRAVENOUS

## 2017-02-16 MED ORDER — ACETAMINOPHEN 500 MG PO TABS
1000.0000 mg | ORAL_TABLET | ORAL | Status: AC
  Administered 2017-02-16: 1000 mg via ORAL

## 2017-02-16 MED ORDER — DEXAMETHASONE SODIUM PHOSPHATE 10 MG/ML IJ SOLN
INTRAMUSCULAR | Status: AC
  Filled 2017-02-16: qty 1

## 2017-02-16 MED ORDER — ONDANSETRON HCL 4 MG/2ML IJ SOLN: 4.0000 mg | Freq: Once | INTRAMUSCULAR | Status: DC | PRN

## 2017-02-16 MED ORDER — CEFAZOLIN SODIUM-DEXTROSE 2-4 GM/100ML-% IV SOLN
INTRAVENOUS | Status: AC
  Filled 2017-02-16: qty 100

## 2017-02-16 MED ORDER — CELECOXIB 200 MG PO CAPS
200.0000 mg | ORAL_CAPSULE | ORAL | Status: AC
  Administered 2017-02-16: 200 mg via ORAL

## 2017-02-16 MED ORDER — PROPOFOL 500 MG/50ML IV EMUL
INTRAVENOUS | Status: AC
  Filled 2017-02-16: qty 50

## 2017-02-16 MED ORDER — ACETAMINOPHEN 500 MG PO TABS
ORAL_TABLET | ORAL | Status: AC
  Filled 2017-02-16: qty 2

## 2017-02-16 MED ORDER — MIDAZOLAM HCL 2 MG/2ML IJ SOLN
1.0000 mg | INTRAMUSCULAR | Status: DC | PRN
  Administered 2017-02-16: 2 mg via INTRAVENOUS

## 2017-02-16 MED ORDER — MIDAZOLAM HCL 2 MG/2ML IJ SOLN
INTRAMUSCULAR | Status: AC
  Filled 2017-02-16: qty 2

## 2017-02-16 MED ORDER — LIDOCAINE 2% (20 MG/ML) 5 ML SYRINGE
INTRAMUSCULAR | Status: DC | PRN
  Administered 2017-02-16: 100 mg via INTRAVENOUS

## 2017-02-16 MED ORDER — LACTATED RINGERS IV SOLN
INTRAVENOUS | Status: DC
  Administered 2017-02-16: 07:00:00 via INTRAVENOUS

## 2017-02-16 SURGICAL SUPPLY — 58 items
APPLIER CLIP 9.375 MED OPEN (MISCELLANEOUS)
BINDER BREAST 3XL (GAUZE/BANDAGES/DRESSINGS) ×2 IMPLANT
BINDER BREAST LRG (GAUZE/BANDAGES/DRESSINGS) IMPLANT
BINDER BREAST MEDIUM (GAUZE/BANDAGES/DRESSINGS) IMPLANT
BINDER BREAST XLRG (GAUZE/BANDAGES/DRESSINGS) IMPLANT
BINDER BREAST XXLRG (GAUZE/BANDAGES/DRESSINGS) IMPLANT
BLADE SURG 15 STRL LF DISP TIS (BLADE) ×1 IMPLANT
BLADE SURG 15 STRL SS (BLADE) ×1
CANISTER SUC SOCK COL 7IN (MISCELLANEOUS) IMPLANT
CANISTER SUCT 1200ML W/VALVE (MISCELLANEOUS) IMPLANT
CHLORAPREP W/TINT 26ML (MISCELLANEOUS) ×2 IMPLANT
CLIP APPLIE 9.375 MED OPEN (MISCELLANEOUS) IMPLANT
CLIP VESOCCLUDE SM WIDE 6/CT (CLIP) IMPLANT
COVER BACK TABLE 60X90IN (DRAPES) ×2 IMPLANT
COVER MAYO STAND STRL (DRAPES) ×2 IMPLANT
COVER PROBE W GEL 5X96 (DRAPES) ×2 IMPLANT
DECANTER SPIKE VIAL GLASS SM (MISCELLANEOUS) IMPLANT
DERMABOND ADVANCED (GAUZE/BANDAGES/DRESSINGS) ×1
DERMABOND ADVANCED .7 DNX12 (GAUZE/BANDAGES/DRESSINGS) ×1 IMPLANT
DEVICE DUBIN W/COMP PLATE 8390 (MISCELLANEOUS) ×2 IMPLANT
DRAPE LAPAROSCOPIC ABDOMINAL (DRAPES) ×2 IMPLANT
DRAPE UTILITY XL STRL (DRAPES) ×2 IMPLANT
DRSG TEGADERM 4X4.75 (GAUZE/BANDAGES/DRESSINGS) IMPLANT
ELECT COATED BLADE 2.86 ST (ELECTRODE) ×2 IMPLANT
ELECT REM PT RETURN 9FT ADLT (ELECTROSURGICAL) ×2
ELECTRODE REM PT RTRN 9FT ADLT (ELECTROSURGICAL) ×1 IMPLANT
GAUZE SPONGE 4X4 12PLY STRL LF (GAUZE/BANDAGES/DRESSINGS) IMPLANT
GLOVE BIO SURGEON STRL SZ7 (GLOVE) ×4 IMPLANT
GLOVE BIOGEL PI IND STRL 7.0 (GLOVE) ×2 IMPLANT
GLOVE BIOGEL PI IND STRL 7.5 (GLOVE) ×1 IMPLANT
GLOVE BIOGEL PI INDICATOR 7.0 (GLOVE) ×2
GLOVE BIOGEL PI INDICATOR 7.5 (GLOVE) ×1
GLOVE SURG SS PI 6.5 STRL IVOR (GLOVE) ×2 IMPLANT
GOWN STRL REUS W/ TWL LRG LVL3 (GOWN DISPOSABLE) ×2 IMPLANT
GOWN STRL REUS W/TWL LRG LVL3 (GOWN DISPOSABLE) ×2
HEMOSTAT ARISTA ABSORB 3G PWDR (MISCELLANEOUS) IMPLANT
ILLUMINATOR WAVEGUIDE N/F (MISCELLANEOUS) IMPLANT
KIT MARKER MARGIN INK (KITS) ×2 IMPLANT
LIGHT WAVEGUIDE WIDE FLAT (MISCELLANEOUS) IMPLANT
NEEDLE HYPO 25X1 1.5 SAFETY (NEEDLE) ×2 IMPLANT
NS IRRIG 1000ML POUR BTL (IV SOLUTION) IMPLANT
PACK BASIN DAY SURGERY FS (CUSTOM PROCEDURE TRAY) ×2 IMPLANT
PENCIL BUTTON HOLSTER BLD 10FT (ELECTRODE) ×2 IMPLANT
SLEEVE SCD COMPRESS KNEE MED (MISCELLANEOUS) ×2 IMPLANT
SPONGE LAP 4X18 X RAY DECT (DISPOSABLE) ×2 IMPLANT
STRIP CLOSURE SKIN 1/2X4 (GAUZE/BANDAGES/DRESSINGS) ×2 IMPLANT
SUT MNCRL AB 4-0 PS2 18 (SUTURE) IMPLANT
SUT MON AB 5-0 PS2 18 (SUTURE) ×2 IMPLANT
SUT SILK 2 0 SH (SUTURE) IMPLANT
SUT VIC AB 2-0 SH 27 (SUTURE) ×1
SUT VIC AB 2-0 SH 27XBRD (SUTURE) ×1 IMPLANT
SUT VIC AB 3-0 SH 27 (SUTURE) ×1
SUT VIC AB 3-0 SH 27X BRD (SUTURE) ×1 IMPLANT
SYR CONTROL 10ML LL (SYRINGE) ×2 IMPLANT
TOWEL OR 17X24 6PK STRL BLUE (TOWEL DISPOSABLE) ×2 IMPLANT
TOWEL OR NON WOVEN STRL DISP B (DISPOSABLE) ×2 IMPLANT
TUBE CONNECTING 20X1/4 (TUBING) IMPLANT
YANKAUER SUCT BULB TIP NO VENT (SUCTIONS) IMPLANT

## 2017-02-16 NOTE — Anesthesia Procedure Notes (Signed)
Procedure Name: LMA Insertion Date/Time: 02/16/2017 7:36 AM Performed by: Lyndee Leo Pre-anesthesia Checklist: Patient identified, Emergency Drugs available, Suction available and Patient being monitored Patient Re-evaluated:Patient Re-evaluated prior to induction Oxygen Delivery Method: Circle system utilized Preoxygenation: Pre-oxygenation with 100% oxygen Induction Type: IV induction Ventilation: Mask ventilation without difficulty LMA: LMA with gastric port inserted LMA Size: 4.0 Number of attempts: 1 Airway Equipment and Method: Bite block Placement Confirmation: positive ETCO2 Tube secured with: Tape Dental Injury: Teeth and Oropharynx as per pre-operative assessment

## 2017-02-16 NOTE — Interval H&P Note (Signed)
History and Physical Interval Note:  02/16/2017 7:13 AM  Destiny Barry  has presented today for surgery, with the diagnosis of RIGHT BREAST MASS  The various methods of treatment have been discussed with the patient and family. After consideration of risks, benefits and other options for treatment, the patient has consented to  Procedure(s): RIGHT BREAST SEED GUIDED EXCISIONAL BIOPSY (Right) as a surgical intervention .  The patient's history has been reviewed, patient examined, no change in status, stable for surgery.  I have reviewed the patient's chart and labs.  Questions were answered to the patient's satisfaction.     Amairany Schumpert

## 2017-02-16 NOTE — Op Note (Signed)
Preoperative diagnoses: prior right breast cancer, right breast calcifications with core biopsy showing adh Postoperative diagnosis: Same as above Procedure: Right breast seed guided excisional biopsy Surgeon: Dr. Serita Grammes Anesthesia: Gen. Estimated blood loss: minimal Complications: None Drains: None Specimens: Rightbreast tissue marked with paint Sponge and needle count correct at completion Disposition to recovery stable  Indications: This is a 66 yof with history of right breast cancer who has new calcifications on mammogram. Core biopsy was ADH.  We discussed excision with seed guidance due to upgrade risk and history.   Procedure: After informed consent was obtained she was then taken to the operating room. She was given ancef.  Sequential compression devices were on her legs. She was placed under general anesthesia without complication. Her chestwas then prepped and draped in the standard sterile surgical fashion. A surgical timeout was then performed. The seed was in the centralrightbreast. I infiltrated marcaine and made a periareolar incision to hide the scar. I then used the neoprobe to guide excision of the seedand surrounding tissue.  Mammogram confirmed removal of seed and the clip. This was then all sent to pathology. Hemostasis was observed. I closed the breast tissue with a 2-0 Vicryl. The dermis was closed with 3-0 Vicryl and the skin with 5-0 Monocryl.Dermabond and steristrips were placed on the incision. A breast binder was placed. She was transferred to recovery stable

## 2017-02-16 NOTE — Anesthesia Postprocedure Evaluation (Signed)
Anesthesia Post Note  Patient: Destiny Barry  Procedure(s) Performed: RIGHT BREAST SEED GUIDED EXCISIONAL BIOPSY (Right Breast)     Patient location during evaluation: PACU Anesthesia Type: General Level of consciousness: awake and alert Pain management: pain level controlled Vital Signs Assessment: post-procedure vital signs reviewed and stable Respiratory status: spontaneous breathing, nonlabored ventilation, respiratory function stable and patient connected to nasal cannula oxygen Cardiovascular status: blood pressure returned to baseline and stable Postop Assessment: no apparent nausea or vomiting Anesthetic complications: no    Last Vitals:  Vitals:   02/16/17 0822 02/16/17 0830  BP: (!) 117/51 (!) 141/77  Pulse: 85 83  Resp: (!) 27 (!) 28  Temp: 36.8 C   SpO2: 96% 94%    Last Pain: There were no vitals filed for this visit.               Anes Rigel

## 2017-02-16 NOTE — Discharge Instructions (Signed)
°Post Anesthesia Home Care Instructions ° °Activity: °Get plenty of rest for the remainder of the day. A responsible individual must stay with you for 24 hours following the procedure.  °For the next 24 hours, DO NOT: °-Drive a car °-Operate machinery °-Drink alcoholic beverages °-Take any medication unless instructed by your physician °-Make any legal decisions or sign important papers. ° °Meals: °Start with liquid foods such as gelatin or soup. Progress to regular foods as tolerated. Avoid greasy, spicy, heavy foods. If nausea and/or vomiting occur, drink only clear liquids until the nausea and/or vomiting subsides. Call your physician if vomiting continues. ° °Special Instructions/Symptoms: °Your throat may feel dry or sore from the anesthesia or the breathing tube placed in your throat during surgery. If this causes discomfort, gargle with warm salt water. The discomfort should disappear within 24 hours. ° °If you had a scopolamine patch placed behind your ear for the management of post- operative nausea and/or vomiting: ° °1. The medication in the patch is effective for 72 hours, after which it should be removed.  Wrap patch in a tissue and discard in the trash. Wash hands thoroughly with soap and water. °2. You may remove the patch earlier than 72 hours if you experience unpleasant side effects which may include dry mouth, dizziness or visual disturbances. °3. Avoid touching the patch. Wash your hands with soap and water after contact with the patch. °  ° ° ° °[facility] °Office Phone Number [phone] ° °POST OP INSTRUCTIONS ° °Always review your discharge instruction sheet given to you by the facility where your surgery was performed. ° °IF YOU HAVE DISABILITY OR FAMILY LEAVE FORMS, YOU MUST BRING THEM TO THE OFFICE FOR PROCESSING.  DO NOT GIVE THEM TO YOUR DOCTOR. ° °1. A prescription for pain medication may be given to you upon discharge.  Take your pain medication as prescribed, if  needed.  If narcotic pain medicine is not needed, then you may take acetaminophen (Tylenol), naprosyn (Alleve) or ibuprofen (Advil) as needed. °2. Take your usually prescribed medications unless otherwise directed °3. If you need a refill on your pain medication, please contact your pharmacy.  They will contact our office to request authorization.  Prescriptions will not be filled after 5pm or on week-ends. °4. You should eat very light the first 24 hours after surgery, such as soup, crackers, pudding, etc.  Resume your normal diet the day after surgery. °5. Most patients will experience some swelling and bruising in the breast.  Ice packs and a good support bra will help.  Wear the breast binder provided or a sports bra for 72 hours day and night.  After that wear a sports bra during the day until you return to the office. Swelling and bruising can take several days to resolve.  °6. It is common to experience some constipation if taking pain medication after surgery.  Increasing fluid intake and taking a stool softener will usually help or prevent this problem from occurring.  A mild laxative (Milk of Magnesia or Miralax) should be taken according to package directions if there are no bowel movements after 48 hours. °7. Unless discharge instructions indicate otherwise, you may remove your bandages 48 hours after surgery and you may shower at that time.  You may have steri-strips (small skin tapes) in place directly over the incision.  These strips should be left on the skin for 7-10 days and will come off on their own.  If your surgeon used skin glue   on the incision, you may shower in 24 hours.  The glue will flake off over the next 2-3 weeks.  Any sutures or staples will be removed at the office during your follow-up visit. °8. ACTIVITIES:  You may resume regular daily activities (gradually increasing) beginning the next day.  Wearing a good support bra or sports bra minimizes pain and swelling.  You may have  sexual intercourse when it is comfortable. °a. You may drive when you no longer are taking prescription pain medication, you can comfortably wear a seatbelt, and you can safely maneuver your car and apply brakes. °b. RETURN TO WORK:  ______________________________________________________________________________________ °9. You should see your doctor in the office for a follow-up appointment approximately two weeks after your surgery.  Your doctor’s nurse will typically make your follow-up appointment when she calls you with your pathology report.  Expect your pathology report 3-4 business days after your surgery.  You may call to check if you do not hear from us after three days. °10. OTHER INSTRUCTIONS: _______________________________________________________________________________________________ _____________________________________________________________________________________________________________________________________ °_____________________________________________________________________________________________________________________________________ °_____________________________________________________________________________________________________________________________________ ° °WHEN TO CALL DR WAKEFIELD: °1. Fever over 101.0 °2. Nausea and/or vomiting. °3. Extreme swelling or bruising. °4. Continued bleeding from incision. °5. Increased pain, redness, or drainage from the incision. ° °The clinic staff is available to answer your questions during regular business hours.  Please don’t hesitate to call and ask to speak to one of the nurses for clinical concerns.  If you have a medical emergency, go to the nearest emergency room or call 911.  A surgeon from [facility] is always on call at the hospital. ° °For further questions, please visit [email or web address] ° °

## 2017-02-16 NOTE — H&P (Signed)
Destiny Barry is an 56 y.o. female.   Chief Complaint: right breast calcifications HPI: 61 yof screening mm with right breast calcifications.  She has history of right breast cancer at 6 s/p lump/radiation therapy.  She has a9x7x5 mm group of pleomorphic calcs in the upper right breast.  These underwent stereo biopsy. The clip is 1.5-2 cm inferiorly displaced from the biopsy site.  Pathology is adh. She is referred for discussion about excision  Past Medical History:  Diagnosis Date  . Cancer ([place])   . Family history of breast cancer   . Genetic testing 01/29/2017   Common Cancers panel (46 genes) @ [facility] - No pathogenic mutations detected  . Hypertension   . Personal history of radiation therapy    age 52; rt breast    Past Surgical History:  Procedure Laterality Date  . ABDOMINAL HYSTERECTOMY    . BREAST LUMPECTOMY Right     Family History  Problem Relation Age of Onset  . Breast cancer Sister 23       Leukemia at 45; currently 18  . Melanoma Father        dx 49s on leg; currently 1  . Colon cancer Maternal Aunt        dx 93s; currently 90s  . Colon cancer Maternal Uncle        dx 16s; deceased 11s  . Colon cancer Cousin        son of mat uncle with CRC; dx 8s   Social History:  reports that she has never smoked. She has never used smokeless tobacco. She reports that she drinks alcohol. She reports that she does not use drugs.  Allergies: No Known Allergies  Medications Prior to Admission  Medication Sig Dispense Refill  . amlodipine-atorvastatin (CADUET) 2.5-10 MG tablet Take 1 tablet by mouth daily.    . Cholecalciferol (VITAMIN D3) 3000 units TABS Take by mouth.    . Cyanocobalamin (VITAMIN B 12 PO) Take by mouth.    Marland Kitchen glucosamine-chondroitin 500-400 MG tablet Take 1 tablet by mouth 3 (three) times daily.    Marland Kitchen losartan (COZAAR) 50 MG tablet Take 50 mg by mouth daily.    . multivitamin-iron-minerals-folic acid (CENTRUM) chewable tablet Chew 1 tablet by mouth  daily.    Marland Kitchen oxybutynin (DITROPAN-XL) 5 MG 24 hr tablet Take 5 mg by mouth at bedtime.      No results found for this or any previous visit (from the past 48 hour(s)). No results found.  Review of Systems  All other systems reviewed and are negative.   Blood pressure (!) 126/55, pulse 94, temperature 98.6 F (37 C), resp. rate 18, height 5\' 4"  (1.626 m), weight 112.9 kg (249 lb), SpO2 99 %. Physical Exam  Vitals reviewed. Constitutional: She appears well-developed and well-nourished.  Cardiovascular: Normal rate, regular rhythm and normal heart sounds.   Respiratory: Effort normal and breath sounds normal. Right breast exhibits no mass and no nipple discharge. Left breast exhibits no mass and no nipple discharge.  GI: Soft.  Lymphadenopathy:    She has no cervical adenopathy.     Assessment/Plan Right breast calcifications Right breast seed guided excisional biopsy  Xzavior Reinig, MD 02/16/2017, 7:10 AM

## 2017-02-16 NOTE — Transfer of Care (Signed)
Immediate Anesthesia Transfer of Care Note  Patient: Destiny Barry  Procedure(s) Performed: RIGHT BREAST SEED GUIDED EXCISIONAL BIOPSY (Right Breast)  Patient Location: PACU  Anesthesia Type:General  Level of Consciousness: awake, sedated and patient cooperative  Airway & Oxygen Therapy: Patient Spontanous Breathing  Post-op Assessment: Report given to RN and Post -op Vital signs reviewed and stable  Post vital signs: Reviewed and stable  Last Vitals:  Vitals:   02/16/17 0635  BP: (!) 126/55  Pulse: 94  Resp: 18  Temp: 37 C  SpO2: 99%    Last Pain: There were no vitals filed for this visit.       Complications: No apparent anesthesia complications

## 2017-02-17 ENCOUNTER — Encounter (HOSPITAL_BASED_OUTPATIENT_CLINIC_OR_DEPARTMENT_OTHER): Payer: Self-pay | Admitting: General Surgery

## 2017-02-23 ENCOUNTER — Other Ambulatory Visit: Payer: Self-pay | Admitting: General Surgery

## 2017-02-23 DIAGNOSIS — D0511 Intraductal carcinoma in situ of right breast: Secondary | ICD-10-CM

## 2017-02-27 ENCOUNTER — Telehealth: Payer: Self-pay | Admitting: Hematology and Oncology

## 2017-02-27 NOTE — Telephone Encounter (Signed)
Appt has been scheduled for the pt to see Dr. Lindi Adie on 10/29 at 345pm. Pt aware to arrive 30 minutes early.

## 2017-03-02 ENCOUNTER — Encounter: Payer: Self-pay | Admitting: Hematology and Oncology

## 2017-03-02 ENCOUNTER — Telehealth: Payer: Self-pay | Admitting: Hematology and Oncology

## 2017-03-02 ENCOUNTER — Ambulatory Visit (HOSPITAL_BASED_OUTPATIENT_CLINIC_OR_DEPARTMENT_OTHER): Payer: BLUE CROSS/BLUE SHIELD | Admitting: Hematology and Oncology

## 2017-03-02 DIAGNOSIS — Z170 Estrogen receptor positive status [ER+]: Secondary | ICD-10-CM | POA: Diagnosis not present

## 2017-03-02 DIAGNOSIS — Z923 Personal history of irradiation: Secondary | ICD-10-CM

## 2017-03-02 DIAGNOSIS — Z86000 Personal history of in-situ neoplasm of breast: Secondary | ICD-10-CM | POA: Diagnosis not present

## 2017-03-02 DIAGNOSIS — D0511 Intraductal carcinoma in situ of right breast: Secondary | ICD-10-CM | POA: Diagnosis not present

## 2017-03-02 MED ORDER — TAMOXIFEN CITRATE 20 MG PO TABS: 20.0000 mg | ORAL_TABLET | Freq: Every day | ORAL | 3 refills | Status: DC

## 2017-03-02 NOTE — Progress Notes (Signed)
[facility] NOTE  Patient Care Team: Hulan Fess, MD as PCP - General (Family Medicine)  CHIEF COMPLAINTS/PURPOSE OF CONSULTATION:  Newly diagnosed right breast DCIS  HISTORY OF PRESENTING ILLNESS:  Destiny Barry 56 y.o. female is here because of recent diagnosis of right breast DCIS.  Patient had an original diagnosis of DCIS right breast 20 years ago and she underwent lumpectomy followed by radiation.  She does not know her receptors and does not know why she was not offered antiestrogen therapy.  She presented with routine screening mammogram that detected abnormality in the right breast.  She further underwent evaluation with ultrasound and the she underwent a lumpectomy on 02/16/2017 which showed intermediate grade DCIS that is ER PR positive. She was also presented at the multidisciplinary tumor board.  I reviewed her records extensively and collaborated the history with the patient.  SUMMARY OF ONCOLOGIC HISTORY:   Ductal carcinoma in situ (DCIS) of right breast   12/17/2016 Initial Diagnosis    Ductal carcinoma in situ (DCIS) of right breast      02/16/2017 Surgery    Right lumpectomy: DCIS with calcifications 1.5 cm, margins negative, ER 100%, PR 100%, Tis NX stage 0      MEDICAL HISTORY:  Past Medical History:  Diagnosis Date  . Cancer ([place])   . Family history of breast cancer   . Genetic testing 01/29/2017   Common Cancers panel (46 genes) @ [facility] - No pathogenic mutations detected  . Hypertension   . Personal history of radiation therapy    age 21; rt breast    SURGICAL HISTORY: Past Surgical History:  Procedure Laterality Date  . ABDOMINAL HYSTERECTOMY    . BREAST LUMPECTOMY Right   . RADIOACTIVE SEED GUIDED EXCISIONAL BREAST BIOPSY Right 02/16/2017   Procedure: RIGHT BREAST SEED GUIDED EXCISIONAL BIOPSY;  Surgeon: Rolm Bookbinder, MD;  Location: [facility];  Service: General;  Laterality: Right;    SOCIAL  HISTORY: Social History   Social History  . Marital status: Married    Spouse name: N/A  . Number of children: N/A  . Years of education: N/A   Occupational History  . Not on file.   Social History Main Topics  . Smoking status: Never Smoker  . Smokeless tobacco: Never Used  . Alcohol use Yes     Comment: 1-2  . Drug use: No  . Sexual activity: Not on file   Other Topics Concern  . Not on file   Social History Narrative  . No narrative on file    FAMILY HISTORY: Family History  Problem Relation Age of Onset  . Breast cancer Sister 4       Leukemia at 59; currently 91  . Melanoma Father        dx 31s on leg; currently 74  . Colon cancer Maternal Aunt        dx 16s; currently 90s  . Colon cancer Maternal Uncle        dx 50s; deceased 54s  . Colon cancer Cousin        son of mat uncle with CRC; dx 11s    ALLERGIES:  has No Known Allergies.  MEDICATIONS:  Current Outpatient Prescriptions  Medication Sig Dispense Refill  . amlodipine-atorvastatin (CADUET) 2.5-10 MG tablet Take 1 tablet by mouth daily.    . Cholecalciferol (VITAMIN D3) 3000 units TABS Take by mouth.    . Cyanocobalamin (VITAMIN B 12 PO) Take by mouth.    Marland Kitchen  glucosamine-chondroitin 500-400 MG tablet Take 1 tablet by mouth 3 (three) times daily.    Marland Kitchen losartan (COZAAR) 50 MG tablet Take 50 mg by mouth daily.    . multivitamin-iron-minerals-folic acid (CENTRUM) chewable tablet Chew 1 tablet by mouth daily.    Marland Kitchen oxybutynin (DITROPAN-XL) 5 MG 24 hr tablet Take 5 mg by mouth at bedtime.    . traMADol (ULTRAM) 50 MG tablet Take 1 tablet (50 mg total) by mouth every 6 (six) hours as needed. 10 tablet 1   No current facility-administered medications for this visit.     REVIEW OF SYSTEMS:   Constitutional: Denies fevers, chills or abnormal night sweats Eyes: Denies blurriness of vision, double vision or watery eyes Ears, nose, mouth, throat, and face: Denies mucositis or sore throat Respiratory: Denies  cough, dyspnea or wheezes Cardiovascular: Denies palpitation, chest discomfort or lower extremity swelling Gastrointestinal:  Denies nausea, heartburn or change in bowel habits Skin: Denies abnormal skin rashes Lymphatics: Denies new lymphadenopathy or easy bruising Neurological:Denies numbness, tingling or new weaknesses Behavioral/Psych: Mood is stable, no new changes  Breast:  Denies any palpable lumps or discharge All other systems were reviewed with the patient and are negative.  PHYSICAL EXAMINATION: ECOG PERFORMANCE STATUS: 1 - Symptomatic but completely ambulatory  Vitals:   03/02/17 1537  BP: (!) 167/66  Pulse: 93  Resp: 18  Temp: 98.2 F (36.8 C)  SpO2: 94%   There were no vitals filed for this visit.  GENERAL:alert, no distress and comfortable SKIN: skin color, texture, turgor are normal, no rashes or significant lesions EYES: normal, conjunctiva are pink and non-injected, sclera clear OROPHARYNX:no exudate, no erythema and lips, buccal mucosa, and tongue normal  NECK: supple, thyroid normal size, non-tender, without nodularity LYMPH:  no palpable lymphadenopathy in the cervical, axillary or inguinal LUNGS: clear to auscultation and percussion with normal breathing effort HEART: regular rate & rhythm and no murmurs and no lower extremity edema ABDOMEN:abdomen soft, non-tender and normal bowel sounds Musculoskeletal:no cyanosis of digits and no clubbing  PSYCH: alert & oriented x 3 with fluent speech NEURO: no focal motor/sensory deficits  RADIOGRAPHIC STUDIES: I have personally reviewed the radiological reports and agreed with the findings in the report.  ASSESSMENT AND PLAN:  Ductal carcinoma in situ (DCIS) of right breast 1998: Right breast DCIS treated with lumpectomy and radiation 02/16/2017: Right lumpectomy: DCIS with calcifications 1.5 cm, margins negative, ER 100%, PR 100%, Tis NX stage 0  Pathology review: I discussed with the patient the difference  between DCIS and invasive breast cancer. It is considered a precancerous lesion. DCIS is classified as a 0. It is generally detected through mammograms as calcifications. We discussed the significance of grades and its impact on prognosis. We also discussed the importance of ER and PR receptors and their implications to adjuvant treatment options. Prognosis of DCIS dependence on grade, comedo necrosis. It is anticipated that if not treated, 20-30% of DCIS can develop into invasive breast cancer.  Recommendation: 1.  Since patient previously received radiation therapy, I do not believe she would be eligible to receive any more radiation.  Patient is also not interested in receiving any more radiation. 2. Followed by antiestrogen therapy with tamoxifen 5 years  Tamoxifen counseling: We discussed the risks and benefits of tamoxifen. These include but not limited to insomnia, hot flashes, mood changes, vaginal dryness, and weight gain. Although rare, serious side effects including endometrial cancer, risk of blood clots were also discussed. We strongly believe that the benefits  far outweigh the risks. Patient understands these risks and consented to starting treatment. Planned treatment duration is 5 years.  I gave her a prescription for tamoxifen today.  She is scheduled to undergo breast MRI and follow-up with Dr. Donne Hazel.  Based on the discussion if she is eligible to receive antiestrogen therapy, then she will fill the prescription and get started on the treatment.  Return to clinic in 3 months for follow-up   All questions were answered. The patient knows to call the clinic with any problems, questions or concerns.    Rulon Eisenmenger, MD 03/02/17

## 2017-03-02 NOTE — Assessment & Plan Note (Signed)
02/16/2017: Right lumpectomy: DCIS with calcifications 1.5 cm, margins negative, ER 100%, PR 100%, Tis NX stage 0  Pathology review: I discussed with the patient the difference between DCIS and invasive breast cancer. It is considered a precancerous lesion. DCIS is classified as a 0. It is generally detected through mammograms as calcifications. We discussed the significance of grades and its impact on prognosis. We also discussed the importance of ER and PR receptors and their implications to adjuvant treatment options. Prognosis of DCIS dependence on grade, comedo necrosis. It is anticipated that if not treated, 20-30% of DCIS can develop into invasive breast cancer.  Recommendation: 1. Adjuvant radiation therapy 2. Followed by antiestrogen therapy with tamoxifen 5 years  Tamoxifen counseling: We discussed the risks and benefits of tamoxifen. These include but not limited to insomnia, hot flashes, mood changes, vaginal dryness, and weight gain. Although rare, serious side effects including endometrial cancer, risk of blood clots were also discussed. We strongly believe that the benefits far outweigh the risks. Patient understands these risks and consented to starting treatment. Planned treatment duration is 5 years.  Return to clinic after radiation therapy to start tamoxifen.

## 2017-03-02 NOTE — Telephone Encounter (Signed)
Scheduled appt per 10/29 los. Patient did not want los or calendar.

## 2017-03-04 ENCOUNTER — Ambulatory Visit
Admission: RE | Admit: 2017-03-04 | Discharge: 2017-03-04 | Disposition: A | Discharge status: Home / Self Care | Payer: BLUE CROSS/BLUE SHIELD | Source: Ambulatory Visit | Attending: General Surgery | Admitting: General Surgery

## 2017-03-04 DIAGNOSIS — D0511 Intraductal carcinoma in situ of right breast: Secondary | ICD-10-CM

## 2017-03-04 DIAGNOSIS — N6489 Other specified disorders of breast: Secondary | ICD-10-CM | POA: Diagnosis not present

## 2017-03-04 MED ORDER — GADOBENATE DIMEGLUMINE 529 MG/ML IV SOLN
20.0000 mL | Freq: Once | INTRAVENOUS | Status: AC | PRN
  Administered 2017-03-04: 20 mL via INTRAVENOUS

## 2017-03-06 ENCOUNTER — Other Ambulatory Visit: Payer: Self-pay | Admitting: General Surgery

## 2017-03-06 ENCOUNTER — Other Ambulatory Visit: Payer: Self-pay

## 2017-03-06 DIAGNOSIS — R599 Enlarged lymph nodes, unspecified: Secondary | ICD-10-CM

## 2017-03-06 DIAGNOSIS — N63 Unspecified lump in unspecified breast: Secondary | ICD-10-CM

## 2017-03-10 ENCOUNTER — Ambulatory Visit
Admission: RE | Admit: 2017-03-10 | Discharge: 2017-03-10 | Disposition: A | Discharge status: Home / Self Care | Payer: BLUE CROSS/BLUE SHIELD | Source: Ambulatory Visit | Attending: General Surgery | Admitting: General Surgery

## 2017-03-10 DIAGNOSIS — R590 Localized enlarged lymph nodes: Secondary | ICD-10-CM | POA: Diagnosis not present

## 2017-03-10 DIAGNOSIS — R599 Enlarged lymph nodes, unspecified: Secondary | ICD-10-CM

## 2017-03-10 DIAGNOSIS — N6489 Other specified disorders of breast: Secondary | ICD-10-CM | POA: Diagnosis not present

## 2017-03-13 ENCOUNTER — Ambulatory Visit
Admission: RE | Admit: 2017-03-13 | Discharge: 2017-03-13 | Disposition: A | Discharge status: Home / Self Care | Payer: BLUE CROSS/BLUE SHIELD | Source: Ambulatory Visit | Attending: General Surgery | Admitting: General Surgery

## 2017-03-13 DIAGNOSIS — N63 Unspecified lump in unspecified breast: Secondary | ICD-10-CM

## 2017-03-13 DIAGNOSIS — N6489 Other specified disorders of breast: Secondary | ICD-10-CM | POA: Diagnosis not present

## 2017-03-13 DIAGNOSIS — R599 Enlarged lymph nodes, unspecified: Secondary | ICD-10-CM

## 2017-03-13 DIAGNOSIS — D242 Benign neoplasm of left breast: Secondary | ICD-10-CM | POA: Diagnosis not present

## 2017-03-13 MED ORDER — GADOBENATE DIMEGLUMINE 529 MG/ML IV SOLN
20.0000 mL | Freq: Once | INTRAVENOUS | Status: AC | PRN
  Administered 2017-03-13: 20 mL via INTRAVENOUS

## 2017-03-17 ENCOUNTER — Other Ambulatory Visit: Payer: Self-pay | Admitting: General Surgery

## 2017-03-17 DIAGNOSIS — D242 Benign neoplasm of left breast: Secondary | ICD-10-CM

## 2017-03-19 ENCOUNTER — Other Ambulatory Visit: Payer: Self-pay | Admitting: General Surgery

## 2017-03-19 DIAGNOSIS — D242 Benign neoplasm of left breast: Secondary | ICD-10-CM

## 2017-03-25 NOTE — Pre-Procedure Instructions (Signed)
JAYLAA GALLION  03/25/2017      [facility], [place] [address] Phone: [phone] Fax: [phone]    Your procedure is scheduled on April 01, 2017.  Report to [facility] Admitting at 530 AM.  Call this number if you have problems the morning of surgery:  [phone]   Remember:  Do not eat food or drink liquids after midnight.  Take these medicines the morning of surgery with A SIP OF WATER acetaminophen (tylenol) -if needed, amlodipine (norvasc),  Oxybutynin (ditropan), tamoxifen (nolvadex)-if started.  7 days prior to surgery STOP taking any Aspirin (unless otherwise instructed by your surgeon), Aleve, Naproxen, Ibuprofen, Motrin, Advil, Goody's, BC's, all herbal medications, fish oil, and all vitamins  Continue all other medications as instructed by your physician except follow the above medication instructions before surgery   Do not wear jewelry, make-up or nail polish.  Do not wear lotions, powders, or perfumes, or deoderant.  Do not shave 48 hours prior to surgery.    Do not bring valuables to the hospital.  [facility] is not responsible for any belongings or valuables.  Contacts, dentures or bridgework may not be worn into surgery.  Leave your suitcase in the car.  After surgery it may be brought to your room.  For patients admitted to the hospital, discharge time will be determined by your treatment team.  Patients discharged the day of surgery will not be allowed to drive home.   Special instructions:   [facility]- Preparing For Surgery  Before surgery, you can play an important role. Because skin is not sterile, your skin needs to be as free of germs as possible. You can reduce the number of germs on your skin by washing with CHG (chlorahexidine gluconate) Soap before surgery.  CHG is an antiseptic cleaner which kills germs and bonds with the skin  to continue killing germs even after washing.  Please do not use if you have an allergy to CHG or antibacterial soaps. If your skin becomes reddened/irritated stop using the CHG.  Do not shave (including legs and underarms) for at least 48 hours prior to first CHG shower. It is OK to shave your face.  Please follow these instructions carefully.   1. Shower the NIGHT BEFORE SURGERY and the MORNING OF SURGERY with CHG.   2. If you chose to wash your hair, wash your hair first as usual with your normal shampoo.  3. After you shampoo, rinse your hair and body thoroughly to remove the shampoo.  4. Use CHG as you would any other liquid soap. You can apply CHG directly to the skin and wash gently with a scrungie or a clean washcloth.   5. Apply the CHG Soap to your body ONLY FROM THE NECK DOWN.  Do not use on open wounds or open sores. Avoid contact with your eyes, ears, mouth and genitals (private parts). Wash Face and genitals (private parts)  with your normal soap.  6. Wash thoroughly, paying special attention to the area where your surgery will be performed.  7. Thoroughly rinse your body with warm water from the neck down.  8. DO NOT shower/wash with your normal soap after using and rinsing off the CHG Soap.  9. Pat yourself dry with a CLEAN TOWEL.  10. Wear CLEAN PAJAMAS to bed the night before surgery, wear comfortable clothes the morning of surgery  11.  Place CLEAN SHEETS on your bed the night of your first shower and DO NOT SLEEP WITH PETS.    Day of Surgery: Do not apply any deodorants/lotions. Please wear clean clothes to the hospital/surgery center.     Please read over the following fact sheets that you were given. Pain Booklet, Coughing and Deep Breathing and Surgical Site Infection Prevention

## 2017-03-25 NOTE — Progress Notes (Signed)
PCP:  Cardiologist:  EKG:  Stress test:  ECHO:  Cardiac Cath:  Chest x-ray

## 2017-03-27 ENCOUNTER — Other Ambulatory Visit: Payer: Self-pay

## 2017-03-27 ENCOUNTER — Encounter (HOSPITAL_COMMUNITY): Payer: Self-pay

## 2017-03-27 ENCOUNTER — Encounter (HOSPITAL_COMMUNITY)
Admission: RE | Admit: 2017-03-27 | Discharge: 2017-03-27 | Disposition: A | Discharge status: Home / Self Care | Payer: BLUE CROSS/BLUE SHIELD | Source: Ambulatory Visit | Attending: General Surgery | Admitting: General Surgery

## 2017-03-27 DIAGNOSIS — Z01818 Encounter for other preprocedural examination: Secondary | ICD-10-CM | POA: Insufficient documentation

## 2017-03-27 DIAGNOSIS — D242 Benign neoplasm of left breast: Secondary | ICD-10-CM | POA: Diagnosis not present

## 2017-03-27 LAB — BASIC METABOLIC PANEL
Anion gap: 7 (ref 5–15)
BUN: 9 mg/dL (ref 6–20)
CHLORIDE: 107 mmol/L (ref 101–111)
CO2: 26 mmol/L (ref 22–32)
CREATININE: 0.71 mg/dL (ref 0.44–1.00)
Calcium: 9.3 mg/dL (ref 8.9–10.3)
Glucose, Bld: 115 mg/dL — ABNORMAL HIGH (ref 65–99)
POTASSIUM: 4.3 mmol/L (ref 3.5–5.1)
SODIUM: 140 mmol/L (ref 135–145)

## 2017-03-27 LAB — CBC
HCT: 43.6 % (ref 36.0–46.0)
Hemoglobin: 13.6 g/dL (ref 12.0–15.0)
MCH: 27.2 pg (ref 26.0–34.0)
MCHC: 31.2 g/dL (ref 30.0–36.0)
MCV: 87.2 fL (ref 78.0–100.0)
PLATELETS: 290 10*3/uL (ref 150–400)
RBC: 5 MIL/uL (ref 3.87–5.11)
RDW: 14.1 % (ref 11.5–15.5)
WBC: 7.5 10*3/uL (ref 4.0–10.5)

## 2017-03-27 NOTE — Progress Notes (Addendum)
PCP: Dr. Hulan Fess  Cardiologist: pt denies  EKG: 02/09/17  Stress test: pt denies ever  ECHO: pt denies ever  Cardiac Cath: pt denies ever  Chest x-ray: pt denies past year, no recent respiratory complications, infections  Patient refuses to drink Ensure pre-surgery drink, she said it has made her sick in the past and she was told that she did not have to drink it.

## 2017-03-30 ENCOUNTER — Ambulatory Visit
Admission: RE | Admit: 2017-03-30 | Discharge: 2017-03-30 | Disposition: A | Discharge status: Home / Self Care | Payer: BLUE CROSS/BLUE SHIELD | Source: Ambulatory Visit | Attending: General Surgery | Admitting: General Surgery

## 2017-03-30 DIAGNOSIS — D242 Benign neoplasm of left breast: Secondary | ICD-10-CM

## 2017-03-31 NOTE — Anesthesia Preprocedure Evaluation (Addendum)
Anesthesia Evaluation  Patient identified by MRN, date of birth, ID band Patient awake    Reviewed: Allergy & Precautions, H&P , Patient's Chart, lab work & pertinent test results, reviewed documented beta blocker date and time   Airway Mallampati: IV  TM Distance: >3 FB Neck ROM: full    Dental no notable dental hx. (+)    Pulmonary    Pulmonary exam normal breath sounds clear to auscultation       Cardiovascular hypertension,  Rhythm:regular Rate:Normal     Neuro/Psych    GI/Hepatic   Endo/Other  Morbid obesity  Renal/GU negative Renal ROS  negative genitourinary   Musculoskeletal   Abdominal   Peds  Hematology   Anesthesia Other Findings   Reproductive/Obstetrics                            Anesthesia Physical  Anesthesia Plan  ASA: III  Anesthesia Plan: General   Post-op Pain Management:    Induction: Intravenous  PONV Risk Score and Plan: 3 and Ondansetron, Dexamethasone, Midazolam, Treatment may vary due to age or medical condition and Scopolamine patch - Pre-op  Airway Management Planned: LMA  Additional Equipment:   Intra-op Plan:   Post-operative Plan: Extubation in OR  Informed Consent: I have reviewed the patients History and Physical, chart, labs and discussed the procedure including the risks, benefits and alternatives for the proposed anesthesia with the patient or authorized representative who has indicated his/her understanding and acceptance.   Dental Advisory Given  Plan Discussed with: CRNA and Surgeon  Anesthesia Plan Comments: (  )       Anesthesia Quick Evaluation

## 2017-04-01 ENCOUNTER — Ambulatory Visit (HOSPITAL_COMMUNITY): Payer: BLUE CROSS/BLUE SHIELD | Admitting: Vascular Surgery

## 2017-04-01 ENCOUNTER — Ambulatory Visit (HOSPITAL_COMMUNITY): Payer: BLUE CROSS/BLUE SHIELD | Admitting: Anesthesiology

## 2017-04-01 ENCOUNTER — Encounter (HOSPITAL_COMMUNITY)
Admission: RE | Disposition: A | Discharge status: Home / Self Care | Payer: Self-pay | Source: Ambulatory Visit | Attending: General Surgery

## 2017-04-01 ENCOUNTER — Encounter (HOSPITAL_COMMUNITY): Payer: Self-pay

## 2017-04-01 ENCOUNTER — Ambulatory Visit
Admission: RE | Admit: 2017-04-01 | Discharge: 2017-04-01 | Disposition: A | Discharge status: Home / Self Care | Payer: BLUE CROSS/BLUE SHIELD | Source: Ambulatory Visit | Attending: General Surgery | Admitting: General Surgery

## 2017-04-01 ENCOUNTER — Ambulatory Visit (HOSPITAL_COMMUNITY)
Admission: RE | Admit: 2017-04-01 | Discharge: 2017-04-01 | Disposition: A | Discharge status: Home / Self Care | Payer: BLUE CROSS/BLUE SHIELD | Source: Ambulatory Visit | Attending: General Surgery | Admitting: General Surgery

## 2017-04-01 DIAGNOSIS — Z923 Personal history of irradiation: Secondary | ICD-10-CM | POA: Diagnosis not present

## 2017-04-01 DIAGNOSIS — D0511 Intraductal carcinoma in situ of right breast: Secondary | ICD-10-CM | POA: Diagnosis not present

## 2017-04-01 DIAGNOSIS — Z803 Family history of malignant neoplasm of breast: Secondary | ICD-10-CM | POA: Diagnosis not present

## 2017-04-01 DIAGNOSIS — Z170 Estrogen receptor positive status [ER+]: Secondary | ICD-10-CM | POA: Diagnosis not present

## 2017-04-01 DIAGNOSIS — Z6841 Body Mass Index (BMI) 40.0 and over, adult: Secondary | ICD-10-CM | POA: Insufficient documentation

## 2017-04-01 DIAGNOSIS — Z79810 Long term (current) use of selective estrogen receptor modulators (SERMs): Secondary | ICD-10-CM | POA: Diagnosis not present

## 2017-04-01 DIAGNOSIS — Z885 Allergy status to narcotic agent status: Secondary | ICD-10-CM | POA: Diagnosis not present

## 2017-04-01 DIAGNOSIS — D242 Benign neoplasm of left breast: Secondary | ICD-10-CM

## 2017-04-01 DIAGNOSIS — R928 Other abnormal and inconclusive findings on diagnostic imaging of breast: Secondary | ICD-10-CM | POA: Diagnosis not present

## 2017-04-01 DIAGNOSIS — Z79899 Other long term (current) drug therapy: Secondary | ICD-10-CM | POA: Diagnosis not present

## 2017-04-01 DIAGNOSIS — I10 Essential (primary) hypertension: Secondary | ICD-10-CM | POA: Insufficient documentation

## 2017-04-01 DIAGNOSIS — N6012 Diffuse cystic mastopathy of left breast: Secondary | ICD-10-CM | POA: Insufficient documentation

## 2017-04-01 DIAGNOSIS — N6320 Unspecified lump in the left breast, unspecified quadrant: Secondary | ICD-10-CM | POA: Diagnosis not present

## 2017-04-01 HISTORY — PX: RADIOACTIVE SEED GUIDED EXCISIONAL BREAST BIOPSY: SHX6490

## 2017-04-01 SURGERY — RADIOACTIVE SEED GUIDED BREAST BIOPSY
Anesthesia: General | Site: Breast | Laterality: Left

## 2017-04-01 MED ORDER — ACETAMINOPHEN 500 MG PO TABS
1000.0000 mg | ORAL_TABLET | ORAL | Status: AC
  Administered 2017-04-01: 1000 mg via ORAL

## 2017-04-01 MED ORDER — BUPIVACAINE-EPINEPHRINE 0.25% -1:200000 IJ SOLN
INTRAMUSCULAR | Status: DC | PRN
Start: 2017-04-01 — End: 2017-04-01
  Administered 2017-04-01: 10 mL

## 2017-04-01 MED ORDER — HYDROCODONE-ACETAMINOPHEN 10-325 MG PO TABS: 1.0000 | ORAL_TABLET | Freq: Four times a day (QID) | ORAL | 0 refills | Status: AC | PRN

## 2017-04-01 MED ORDER — FENTANYL CITRATE (PF) 100 MCG/2ML IJ SOLN
INTRAMUSCULAR | Status: DC | PRN
  Administered 2017-04-01: 50 ug via INTRAVENOUS
  Administered 2017-04-01 (×3): 25 ug via INTRAVENOUS

## 2017-04-01 MED ORDER — LACTATED RINGERS IV SOLN
INTRAVENOUS | Status: DC | PRN
  Administered 2017-04-01: 07:00:00 via INTRAVENOUS

## 2017-04-01 MED ORDER — MIDAZOLAM HCL 2 MG/2ML IJ SOLN
INTRAMUSCULAR | Status: AC
  Filled 2017-04-01: qty 2

## 2017-04-01 MED ORDER — BUPIVACAINE-EPINEPHRINE (PF) 0.25% -1:200000 IJ SOLN
INTRAMUSCULAR | Status: AC
  Filled 2017-04-01: qty 30

## 2017-04-01 MED ORDER — SCOPOLAMINE 1 MG/3DAYS TD PT72
MEDICATED_PATCH | TRANSDERMAL | Status: AC
  Filled 2017-04-01: qty 1

## 2017-04-01 MED ORDER — GABAPENTIN 300 MG PO CAPS
ORAL_CAPSULE | ORAL | Status: AC
  Administered 2017-04-01: 300 mg via ORAL
  Filled 2017-04-01: qty 1

## 2017-04-01 MED ORDER — PHENYLEPHRINE 40 MCG/ML (10ML) SYRINGE FOR IV PUSH (FOR BLOOD PRESSURE SUPPORT)
PREFILLED_SYRINGE | INTRAVENOUS | Status: DC | PRN
Start: 2017-04-01 — End: 2017-04-01
  Administered 2017-04-01 (×2): 80 ug via INTRAVENOUS

## 2017-04-01 MED ORDER — LIDOCAINE HCL (CARDIAC) 20 MG/ML IV SOLN
INTRAVENOUS | Status: DC | PRN
  Administered 2017-04-01: 30 mg via INTRAVENOUS

## 2017-04-01 MED ORDER — CEFAZOLIN SODIUM-DEXTROSE 2-4 GM/100ML-% IV SOLN
2.0000 g | INTRAVENOUS | Status: AC
  Administered 2017-04-01: 2 g via INTRAVENOUS

## 2017-04-01 MED ORDER — SCOPOLAMINE 1 MG/3DAYS TD PT72
MEDICATED_PATCH | TRANSDERMAL | Status: DC | PRN
  Administered 2017-04-01: 1 via TRANSDERMAL

## 2017-04-01 MED ORDER — EPHEDRINE SULFATE 50 MG/ML IJ SOLN
INTRAMUSCULAR | Status: DC | PRN
  Administered 2017-04-01: 5 mg via INTRAVENOUS

## 2017-04-01 MED ORDER — CEFAZOLIN SODIUM-DEXTROSE 2-4 GM/100ML-% IV SOLN
INTRAVENOUS | Status: AC
  Filled 2017-04-01: qty 100

## 2017-04-01 MED ORDER — CELECOXIB 200 MG PO CAPS
ORAL_CAPSULE | ORAL | Status: AC
  Administered 2017-04-01: 200 mg via ORAL
  Filled 2017-04-01: qty 1

## 2017-04-01 MED ORDER — MIDAZOLAM HCL 5 MG/5ML IJ SOLN
INTRAMUSCULAR | Status: DC | PRN
  Administered 2017-04-01: 2 mg via INTRAVENOUS

## 2017-04-01 MED ORDER — ACETAMINOPHEN 500 MG PO TABS
ORAL_TABLET | ORAL | Status: AC
  Administered 2017-04-01: 1000 mg via ORAL
  Filled 2017-04-01: qty 2

## 2017-04-01 MED ORDER — FENTANYL CITRATE (PF) 250 MCG/5ML IJ SOLN
INTRAMUSCULAR | Status: AC
  Filled 2017-04-01: qty 5

## 2017-04-01 MED ORDER — DEXAMETHASONE SODIUM PHOSPHATE 10 MG/ML IJ SOLN
INTRAMUSCULAR | Status: DC | PRN
  Administered 2017-04-01: 10 mg via INTRAVENOUS

## 2017-04-01 MED ORDER — PROPOFOL 10 MG/ML IV BOLUS
INTRAVENOUS | Status: AC
  Filled 2017-04-01: qty 40

## 2017-04-01 MED ORDER — PROPOFOL 10 MG/ML IV BOLUS
INTRAVENOUS | Status: DC | PRN
  Administered 2017-04-01: 170 mg via INTRAVENOUS
  Administered 2017-04-01: 30 mg via INTRAVENOUS

## 2017-04-01 MED ORDER — 0.9 % SODIUM CHLORIDE (POUR BTL) OPTIME
TOPICAL | Status: DC | PRN
  Administered 2017-04-01: 1000 mL

## 2017-04-01 MED ORDER — FENTANYL CITRATE (PF) 100 MCG/2ML IJ SOLN: 25.0000 ug | INTRAMUSCULAR | Status: DC | PRN

## 2017-04-01 MED ORDER — ONDANSETRON HCL 4 MG/2ML IJ SOLN
INTRAMUSCULAR | Status: DC | PRN
  Administered 2017-04-01: 4 mg via INTRAVENOUS

## 2017-04-01 MED ORDER — GABAPENTIN 300 MG PO CAPS
300.0000 mg | ORAL_CAPSULE | ORAL | Status: AC
  Administered 2017-04-01: 300 mg via ORAL

## 2017-04-01 MED ORDER — CELECOXIB 200 MG PO CAPS
200.0000 mg | ORAL_CAPSULE | ORAL | Status: AC
  Administered 2017-04-01: 200 mg via ORAL

## 2017-04-01 SURGICAL SUPPLY — 42 items
BINDER BREAST XLRG (GAUZE/BANDAGES/DRESSINGS) IMPLANT
BINDER BREAST XXLRG (GAUZE/BANDAGES/DRESSINGS) ×2 IMPLANT
BLADE SURG 10 STRL SS (BLADE) ×2 IMPLANT
BLADE SURG 15 STRL LF DISP TIS (BLADE) ×1 IMPLANT
BLADE SURG 15 STRL SS (BLADE) ×1
CHLORAPREP W/TINT 26ML (MISCELLANEOUS) ×2 IMPLANT
COVER PROBE W GEL 5X96 (DRAPES) ×2 IMPLANT
COVER SURGICAL LIGHT HANDLE (MISCELLANEOUS) ×2 IMPLANT
DERMABOND ADVANCED (GAUZE/BANDAGES/DRESSINGS) ×1
DERMABOND ADVANCED .7 DNX12 (GAUZE/BANDAGES/DRESSINGS) ×1 IMPLANT
DEVICE DUBIN SPECIMEN MAMMOGRA (MISCELLANEOUS) ×2 IMPLANT
DRAPE CHEST BREAST 15X10 FENES (DRAPES) ×2 IMPLANT
DRAPE UTILITY XL STRL (DRAPES) ×2 IMPLANT
ELECT COATED BLADE 2.86 ST (ELECTRODE) ×2 IMPLANT
ELECT REM PT RETURN 9FT ADLT (ELECTROSURGICAL) ×2
ELECTRODE REM PT RTRN 9FT ADLT (ELECTROSURGICAL) ×1 IMPLANT
GLOVE BIO SURGEON STRL SZ7 (GLOVE) ×2 IMPLANT
GLOVE BIOGEL PI IND STRL 7.5 (GLOVE) ×1 IMPLANT
GLOVE BIOGEL PI INDICATOR 7.5 (GLOVE) ×1
GOWN STRL REUS W/ TWL LRG LVL3 (GOWN DISPOSABLE) ×2 IMPLANT
GOWN STRL REUS W/TWL LRG LVL3 (GOWN DISPOSABLE) ×2
ILLUMINATOR WAVEGUIDE N/F (MISCELLANEOUS) ×2 IMPLANT
KIT BASIN OR (CUSTOM PROCEDURE TRAY) ×2 IMPLANT
KIT MARKER MARGIN INK (KITS) ×2 IMPLANT
MARKER SKIN DUAL TIP RULER LAB (MISCELLANEOUS) ×2 IMPLANT
NEEDLE HYPO 25GX1X1/2 BEV (NEEDLE) ×2 IMPLANT
NS IRRIG 1000ML POUR BTL (IV SOLUTION) ×2 IMPLANT
PACK SURGICAL SETUP 50X90 (CUSTOM PROCEDURE TRAY) ×2 IMPLANT
PENCIL BUTTON HOLSTER BLD 10FT (ELECTRODE) ×2 IMPLANT
SPONGE LAP 18X18 X RAY DECT (DISPOSABLE) ×2 IMPLANT
STRIP CLOSURE SKIN 1/2X4 (GAUZE/BANDAGES/DRESSINGS) ×2 IMPLANT
SUT MNCRL AB 4-0 PS2 18 (SUTURE) ×4 IMPLANT
SUT SILK 2 0 SH (SUTURE) ×2 IMPLANT
SUT VIC AB 2-0 SH 27 (SUTURE) ×1
SUT VIC AB 2-0 SH 27XBRD (SUTURE) ×1 IMPLANT
SUT VIC AB 3-0 SH 27 (SUTURE) ×1
SUT VIC AB 3-0 SH 27X BRD (SUTURE) ×1 IMPLANT
SYR BULB 3OZ (MISCELLANEOUS) ×2 IMPLANT
SYR CONTROL 10ML LL (SYRINGE) ×2 IMPLANT
TOWEL OR 17X24 6PK STRL BLUE (TOWEL DISPOSABLE) ×2 IMPLANT
TUBE CONNECTING 12X1/4 (SUCTIONS) ×2 IMPLANT
YANKAUER SUCT BULB TIP NO VENT (SUCTIONS) ×2 IMPLANT

## 2017-04-01 NOTE — Interval H&P Note (Signed)
History and Physical Interval Note:  04/01/2017 7:01 AM  Destiny Barry  has presented today for surgery, with the diagnosis of left breast mass  The various methods of treatment have been discussed with the patient and family. After consideration of risks, benefits and other options for treatment, the patient has consented to  Procedure(s): RADIOACTIVE SEED GUIDED EXCISIONAL LEFT BREAST BIOPSY (Left) as a surgical intervention .  The patient's history has been reviewed, patient examined, no change in status, stable for surgery.  I have reviewed the patient's chart and labs.  Questions were answered to the patient's satisfaction.     Rolm Bookbinder

## 2017-04-01 NOTE — Transfer of Care (Signed)
Immediate Anesthesia Transfer of Care Note  Patient: Destiny Barry  Procedure(s) Performed: RADIOACTIVE SEED GUIDED EXCISIONAL LEFT BREAST BIOPSY (Left Breast)  Patient Location: PACU  Anesthesia Type:General  Level of Consciousness: awake, alert  and oriented  Airway & Oxygen Therapy: Patient Spontanous Breathing and Patient connected to face mask oxygen  Post-op Assessment: Report given to RN, Post -op Vital signs reviewed and stable and Patient moving all extremities X 4  Post vital signs: Reviewed and stable  Last Vitals:  Vitals:   04/01/17 0558  BP: (!) 159/76  Pulse: 67  Resp: 20  Temp: 36.9 C  SpO2: 97%    Last Pain:  Vitals:   04/01/17 0558  TempSrc: Oral         Complications: No apparent anesthesia complications

## 2017-04-01 NOTE — H&P (Signed)
Destiny Barry is an 56 y.o. female.   Chief Complaint: breast cancer HPI:  11 yof with history of right breast/lump for dcis 20 years ago. she did not take antiestrogens. she did undergo radiotherapy which I confirmed with rad onc. she underwent core biopsy on right for abnl calcs that was adh. I did excisional biopsy that is int grade er/pr pos dcis that is 1.5 cm in size and margins are all clear. no issues after surgery. she also recently had genetic panel testing that is negative. since then she had mri that showed right breaset with just postop changes. the left breast had a 1.5 cm mass in upper medial left breast. there was also 1.5 cm node. she then underwent US guided biopsy of the node that is negative and a biopsy of mr found left breast lesion that is a papilloma. she has been seen by oncology as well.   Past Medical History:  Diagnosis Date  . Cancer ([place])   . Family history of breast cancer   . Genetic testing 01/29/2017   Common Cancers panel (46 genes) @ [facility] - No pathogenic mutations detected  . Hypertension   . Personal history of radiation therapy    age 75; rt breast    Past Surgical History:  Procedure Laterality Date  . ABDOMINAL HYSTERECTOMY    . BREAST LUMPECTOMY Right   . RADIOACTIVE SEED GUIDED EXCISIONAL BREAST BIOPSY Right 02/16/2017   Procedure: RIGHT BREAST SEED GUIDED EXCISIONAL BIOPSY;  Surgeon: Rolm Bookbinder, MD;  Location: [facility];  Service: General;  Laterality: Right;    Family History  Problem Relation Age of Onset  . Breast cancer Sister 38       Leukemia at 66; currently 87  . Melanoma Father        dx 59s on leg; currently 1  . Colon cancer Maternal Aunt        dx 58s; currently 90s  . Colon cancer Maternal Uncle        dx 25s; deceased 41s  . Colon cancer Cousin        son of mat uncle with CRC; dx 76s   Social History:  reports that  has never smoked. she has never used smokeless tobacco. She reports  that she drinks alcohol. She reports that she does not use drugs.  Allergies:  Allergies  Allergen Reactions  . Tramadol Nausea Only    Felt ill after taking     Medications Prior to Admission  Medication Sig Dispense Refill  . amLODipine (NORVASC) 2.5 MG tablet Take 2.5 mg daily by mouth.  0  . Cholecalciferol (VITAMIN D) 2000 units CAPS Take 2,000 Units daily by mouth.    . Cyanocobalamin (B-12) 5000 MCG CAPS Take 5,000 mcg daily by mouth.    . Glucosamine-Chondroit-Vit C-Mn (GLUCOSAMINE 1500 COMPLEX) CAPS Take 1 capsule daily by mouth.    . hydrocortisone cream 1 % Apply 1 application daily as needed topically for itching.    . losartan (COZAAR) 50 MG tablet Take 50 mg by mouth daily.    . Multiple Vitamin (MULTIVITAMIN WITH MINERALS) TABS tablet Take 1 tablet daily by mouth.    . oxybutynin (DITROPAN) 5 MG tablet Take 5 mg daily by mouth.  0  . acetaminophen (TYLENOL) 500 MG tablet Take 1,000 mg 2 (two) times daily as needed by mouth for moderate pain or headache.    . tamoxifen (NOLVADEX) 20 MG tablet Take 1 tablet (20 mg total) by  mouth daily. 90 tablet 3    No results found for this or any previous visit (from the past 48 hour(s)). 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 EXAM: MAMMOGRAPHIC GUIDED RADIOACTIVE SEED LOCALIZATION OF THE LEFT BREAST COMPARISON:  Previous exam(s). FINDINGS: Patient presents for radioactive seed localization prior to surgery. I met with the patient and we discussed the procedure of seed localization including benefits and alternatives. We discussed the high likelihood of a successful procedure. We discussed the risks of the procedure including infection, bleeding, tissue injury and further surgery. We discussed the low dose of radioactivity involved in the procedure. Informed, written consent was given. The usual time-out protocol was performed immediately prior to  the procedure. Using mammographic guidance, sterile technique, 1% lidocaine and an I-125 radioactive seed, the hourglass shaped clip was localized using a superior to inferior approach. The follow-up mammogram images confirm the seed in the expected location and were marked for Dr. Donne Hazel. Follow-up survey of the patient confirms presence of the radioactive seed. Order number of I-125 seed:  [id number]. Total activity:  2.240 millicuries  Reference Date: 03/06/2017 The patient tolerated the procedure well and was released from the [facility]. She was given instructions regarding seed removal. IMPRESSION: Radioactive seed localization left breast. No apparent complications. Electronically Signed   By: Lillia Mountain [name]   On: 03/30/2017 14:58    Review of Systems  All other systems reviewed and are negative.   Blood pressure (!) 159/76, pulse 67, temperature 98.4 F (36.9 C), temperature source Oral, resp. rate 20, SpO2 97 %. Physical Exam  Vitals Malachy Moan RMA; 03/17/2017 10:37 AM) 03/17/2017 10:36 AM Weight: 240 lb Height: 64in Body Surface Area: 2.11 m Body Mass Index: 41.2 kg/m  Temp.: 97.61F  Pulse: 84 (Regular)  BP: 146/90 (Sitting, Left Arm, Standard) cv rrr pulm clear Breast incision healed, no masses No ax adenopathy     Assessment/Plan Assessment & Plan Rolm Bookbinder MD; 03/17/2017 12:04 PM) BREAST NEOPLASM, TIS (DCIS), RIGHT (D05.11) Story: discussed recurrent grade II er/pr pos dcis after bct in past. she cannot get further radiation therapy. discussed standard therapy is mastectomy at this point. she does not want mastectomy. discussed other option would be antiestrogens alone and following her. this may have higher recurrence rate but I dont think completely unreasonable given the characteristics of her dcis. will plan on following on antiestrogens PAPILLOMA OF LEFT BREAST (D24.2) Story: discussed radioactive seed guided excision of the  papilloma.  Rolm Bookbinder, MD 04/01/2017, 7:00 AM

## 2017-04-01 NOTE — Anesthesia Postprocedure Evaluation (Signed)
Anesthesia Post Note  Patient: Destiny Barry  Procedure(s) Performed: RADIOACTIVE SEED GUIDED EXCISIONAL LEFT BREAST BIOPSY (Left Breast)     Patient location during evaluation: PACU Anesthesia Type: General Level of consciousness: awake and alert Pain management: pain level controlled Vital Signs Assessment: post-procedure vital signs reviewed and stable Respiratory status: spontaneous breathing, nonlabored ventilation, respiratory function stable and patient connected to nasal cannula oxygen Cardiovascular status: blood pressure returned to baseline and stable Postop Assessment: no apparent nausea or vomiting Anesthetic complications: no    Last Vitals:  Vitals:   04/01/17 0915 04/01/17 0930  BP: (!) 158/88 (!) 160/88  Pulse: 72 72  Resp: 18 20  Temp:  36.6 C  SpO2: 100%     Last Pain:  Vitals:   04/01/17 0930  TempSrc:   PainSc: [place]

## 2017-04-01 NOTE — Op Note (Signed)
Preoperative diagnoses  Left breast mass with core biopsy c/w papilloma, history right breast dcis Postoperative diagnosis: Same as above Procedure: Leftbreast seed guided excisional biopsy Surgeon: Dr. Serita Grammes Anesthesia: Gen. Estimated blood loss: minimal Complications: None Drains: None Specimens: Leftbreast tissue marked with paint Sponge and needle count correct at completion Disposition to recovery stable  Indications: This is a24 yof with history of left breast dcis and recurrence.  She also has a mri found right breast mass with core biopsy consistent with papilloma. We discussed seed guided excision.    Procedure: After informed consent was obtained she was then taken to the operating room. She was given antibiotics.Sequential compression devices were on her legs. She was placed under general anesthesia without complication. Her chestwas then prepped and draped in the standard sterile surgical fashion. A surgical timeout was then performed.   The seed was in thesuperior central  leftbreast. I infiltrated marcaine and made a periareolar incision to hide the scar. I then used the neoprobe to guide excision of the seedand surrounding tissue.Mammogram confirmed removal of seed and the clip. This was then all sent to pathology. Hemostasis was observed. I closed the breast tissue with a 2-0 Vicryl. The dermis was closed with 3-0 Vicryl and the skin with 5-0 Monocryl.Dermabond and steristrips were placed on the incision. A breast binder was placed. She was transferred to recovery stable

## 2017-04-01 NOTE — Anesthesia Procedure Notes (Signed)
Procedure Name: LMA Insertion Date/Time: 04/01/2017 7:44 AM Performed by: Kyung Rudd, CRNA Pre-anesthesia Checklist: Patient identified, Emergency Drugs available, Suction available, Patient being monitored and Timeout performed Patient Re-evaluated:Patient Re-evaluated prior to induction Oxygen Delivery Method: Circle system utilized Preoxygenation: Pre-oxygenation with 100% oxygen Induction Type: IV induction LMA: LMA inserted LMA Size: 4.0 Number of attempts: 1 Placement Confirmation: positive ETCO2 Tube secured with: Tape Dental Injury: Teeth and Oropharynx as per pre-operative assessment  Comments: LMA inserted by Chrissie Noa

## 2017-04-02 ENCOUNTER — Encounter (HOSPITAL_COMMUNITY): Payer: Self-pay | Admitting: General Surgery

## 2017-05-19 ENCOUNTER — Telehealth: Payer: Self-pay | Admitting: Hematology and Oncology

## 2017-05-19 NOTE — Telephone Encounter (Signed)
Spoke to patient regarding upcoming April appointments per 1/10 sch message

## 2017-06-03 ENCOUNTER — Ambulatory Visit: Payer: BLUE CROSS/BLUE SHIELD | Admitting: Hematology and Oncology

## 2017-08-18 ENCOUNTER — Telehealth: Payer: Self-pay | Admitting: Hematology and Oncology

## 2017-08-18 NOTE — Progress Notes (Deleted)
Patient Care Team: Hulan Fess, MD as PCP - General (Family Medicine)  DIAGNOSIS:  Encounter Diagnosis  Name Primary?  . Ductal carcinoma in situ (DCIS) of right breast     SUMMARY OF ONCOLOGIC HISTORY:   Ductal carcinoma in situ (DCIS) of right breast   12/17/2016 Initial Diagnosis    Ductal carcinoma in situ (DCIS) of right breast      02/16/2017 Surgery    Right lumpectomy: DCIS with calcifications 1.5 cm, margins negative, ER 100%, PR 100%, Tis NX stage 0       CHIEF COMPLIANT: Follow-up on tamoxifen therapy  INTERVAL HISTORY: Destiny Barry is a 57 year old with above-mentioned history of right breast DCIS who underwent right lumpectomy.  This is a recurrence of the right breast cancer.  She previously had right lumpectomy and radiation in 1998.  She was placed on tamoxifen therapy and appears to be tolerating it very well.  She is here for toxicity evaluation.  REVIEW OF SYSTEMS:   Constitutional: Denies fevers, chills or abnormal weight loss Eyes: Denies blurriness of vision Ears, nose, mouth, throat, and face: Denies mucositis or sore throat Respiratory: Denies cough, dyspnea or wheezes Cardiovascular: Denies palpitation, chest discomfort Gastrointestinal:  Denies nausea, heartburn or change in bowel habits Skin: Denies abnormal skin rashes Lymphatics: Denies new lymphadenopathy or easy bruising Neurological:Denies numbness, tingling or new weaknesses Behavioral/Psych: Mood is stable, no new changes  Extremities: No lower extremity edema Breast:  denies any pain or lumps or nodules in either breasts All other systems were reviewed with the patient and are negative.  I have reviewed the past medical history, past surgical history, social history and family history with the patient and they are unchanged from previous note.  ALLERGIES:  is allergic to tramadol.  MEDICATIONS:  Current Outpatient Medications  Medication Sig Dispense Refill  . acetaminophen  (TYLENOL) 500 MG tablet Take 1,000 mg 2 (two) times daily as needed by mouth for moderate pain or headache.    Marland Kitchen amLODipine (NORVASC) 2.5 MG tablet Take 2.5 mg daily by mouth.  0  . Cholecalciferol (VITAMIN D) 2000 units CAPS Take 2,000 Units daily by mouth.    . Cyanocobalamin (B-12) 5000 MCG CAPS Take 5,000 mcg daily by mouth.    . Glucosamine-Chondroit-Vit C-Mn (GLUCOSAMINE 1500 COMPLEX) CAPS Take 1 capsule daily by mouth.    Marland Kitchen HYDROcodone-acetaminophen (NORCO) 10-325 MG tablet Take 1 tablet by mouth every 6 (six) hours as needed. 10 tablet 0  . hydrocortisone cream 1 % Apply 1 application daily as needed topically for itching.    . losartan (COZAAR) 50 MG tablet Take 50 mg by mouth daily.    . Multiple Vitamin (MULTIVITAMIN WITH MINERALS) TABS tablet Take 1 tablet daily by mouth.    . oxybutynin (DITROPAN) 5 MG tablet Take 5 mg daily by mouth.  0  . tamoxifen (NOLVADEX) 20 MG tablet Take 1 tablet (20 mg total) by mouth daily. 90 tablet 3   No current facility-administered medications for this visit.     PHYSICAL EXAMINATION: ECOG PERFORMANCE STATUS: 1 - Symptomatic but completely ambulatory  There were no vitals filed for this visit. There were no vitals filed for this visit.  GENERAL:alert, no distress and comfortable SKIN: skin color, texture, turgor are normal, no rashes or significant lesions EYES: normal, Conjunctiva are pink and non-injected, sclera clear OROPHARYNX:no exudate, no erythema and lips, buccal mucosa, and tongue normal  NECK: supple, thyroid normal size, non-tender, without nodularity LYMPH:  no palpable lymphadenopathy  in the cervical, axillary or inguinal LUNGS: clear to auscultation and percussion with normal breathing effort HEART: regular rate & rhythm and no murmurs and no lower extremity edema ABDOMEN:abdomen soft, non-tender and normal bowel sounds MUSCULOSKELETAL:no cyanosis of digits and no clubbing  NEURO: alert & oriented x 3 with fluent speech, no  focal motor/sensory deficits EXTREMITIES: No lower extremity edema   LABORATORY DATA:  I have reviewed the data as listed CMP Latest Ref Rng & Units 03/27/2017  Glucose 65 - 99 mg/dL 115(H)  BUN 6 - 20 mg/dL 9  Creatinine 0.44 - 1.00 mg/dL 0.71  Sodium 135 - 145 mmol/L 140  Potassium 3.5 - 5.1 mmol/L 4.3  Chloride 101 - 111 mmol/L 107  CO2 22 - 32 mmol/L 26  Calcium 8.9 - 10.3 mg/dL 9.3    Lab Results  Component Value Date   WBC 7.5 03/27/2017   HGB 13.6 03/27/2017   HCT 43.6 03/27/2017   MCV 87.2 03/27/2017   PLT 290 03/27/2017    ASSESSMENT & PLAN:  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  The patient has a good understanding of the overall plan. she agrees with it. she will call with any problems that may develop before the next visit here.   Harriette Ohara, MD 08/18/17

## 2017-08-18 NOTE — Assessment & Plan Note (Deleted)
Ductal carcinoma in situ (DCIS) of right breast 1998: Right breast DCIS treated with lumpectomy and radiation 02/16/2017: Right lumpectomy: DCIS with calcifications 1.5 cm, margins negative, ER 100%, PR 100%, Tis NX stage 0 No role of radiation because she had previous radiation therapy  Current treatment: Tamoxifen 20 mg daily started 03/02/2017 Tamoxifen toxicities:  Return to clinic in 6 months for follow-up for breast exams

## 2017-08-18 NOTE — Telephone Encounter (Signed)
Patient called in and wanted to reschedule appt to 5/9 due to their availability.

## 2017-08-19 ENCOUNTER — Ambulatory Visit: Payer: BLUE CROSS/BLUE SHIELD | Admitting: Hematology and Oncology

## 2017-09-10 ENCOUNTER — Other Ambulatory Visit: Payer: Self-pay

## 2017-09-10 ENCOUNTER — Telehealth: Payer: Self-pay | Admitting: Hematology and Oncology

## 2017-09-10 ENCOUNTER — Telehealth: Payer: Self-pay

## 2017-09-10 ENCOUNTER — Inpatient Hospital Stay: Payer: BLUE CROSS/BLUE SHIELD | Attending: Hematology and Oncology | Admitting: Hematology and Oncology

## 2017-09-10 DIAGNOSIS — D0511 Intraductal carcinoma in situ of right breast: Secondary | ICD-10-CM

## 2017-09-10 DIAGNOSIS — R252 Cramp and spasm: Secondary | ICD-10-CM | POA: Diagnosis not present

## 2017-09-10 MED ORDER — TAMOXIFEN CITRATE 20 MG PO TABS: 20.0000 mg | ORAL_TABLET | Freq: Every day | ORAL | 3 refills | Status: DC

## 2017-09-10 NOTE — Telephone Encounter (Signed)
Gave avs and calendar patient will call breast center

## 2017-09-10 NOTE — Assessment & Plan Note (Signed)
Ductal carcinoma in situ (DCIS) of right breast 1998: Right breast DCIS treated with lumpectomy and radiation 02/16/2017: Right lumpectomy: DCIS with calcifications 1.5 cm, margins negative, ER 100%, PR 100%, Tis NX stage 0 No role of radiation because she had previous radiation therapy  Current treatment: Tamoxifen 20 mg daily started 03/02/2017 Tamoxifen toxicities:  Return to clinic in 6 months for follow-up for breast exams

## 2017-09-10 NOTE — Telephone Encounter (Signed)
Patient called to verify her appointment on today. Per 5/9 phone que

## 2017-09-10 NOTE — Progress Notes (Signed)
Patient Care Team: Hulan Fess, MD as PCP - General (Family Medicine)  DIAGNOSIS:  Encounter Diagnosis  Name Primary?  . Ductal carcinoma in situ (DCIS) of right breast     SUMMARY OF ONCOLOGIC HISTORY:   Ductal carcinoma in situ (DCIS) of right breast   1998 Surgery    Right breast DCIS treated with lumpectomy and radiation      12/17/2016 Initial Diagnosis    Ductal carcinoma in situ (DCIS) of right breast      02/16/2017 Surgery    Right lumpectomy: DCIS with calcifications 1.5 cm, margins negative, ER 100%, PR 100%, Tis NX stage 0      03/02/2017 -  Anti-estrogen oral therapy    Adjuvant tamoxifen 20 mg daily x5 years       CHIEF COMPLIANT: Follow-up on tamoxifen therapy  INTERVAL HISTORY: Destiny Barry is a 41-year with above-mentioned history of DCIS involving the right breast underwent lumpectomy and is currently on adjuvant tamoxifen.  Because of prior radiation therapy she could not receive any more radiation.  She is currently on tamoxifen and appears to be tolerating it fairly well.  She denies any hot flashes or myalgias.  Denies any lumps or nodules in the breast.  She had a few episodes of severe muscle cramps  REVIEW OF SYSTEMS:   Constitutional: Denies fevers, chills or abnormal weight loss Eyes: Denies blurriness of vision Ears, nose, mouth, throat, and face: Denies mucositis or sore throat Respiratory: Denies cough, dyspnea or wheezes Cardiovascular: Denies palpitation, chest discomfort Gastrointestinal:  Denies nausea, heartburn or change in bowel habits Skin: Denies abnormal skin rashes Lymphatics: Denies new lymphadenopathy or easy bruising Neurological:Denies numbness, tingling or new weaknesses Behavioral/Psych: Mood is stable, no new changes  Extremities: No lower extremity edema Breast:  denies any pain or lumps or nodules in either breasts All other systems were reviewed with the patient and are negative.  I have reviewed the past  medical history, past surgical history, social history and family history with the patient and they are unchanged from previous note.  ALLERGIES:  is allergic to tramadol.  MEDICATIONS:  Current Outpatient Medications  Medication Sig Dispense Refill  . acetaminophen (TYLENOL) 500 MG tablet Take 1,000 mg 2 (two) times daily as needed by mouth for moderate pain or headache.    Marland Kitchen amLODipine (NORVASC) 2.5 MG tablet Take 2.5 mg daily by mouth.  0  . Cholecalciferol (VITAMIN D) 2000 units CAPS Take 2,000 Units daily by mouth.    . Cyanocobalamin (B-12) 5000 MCG CAPS Take 5,000 mcg daily by mouth.    . Glucosamine-Chondroit-Vit C-Mn (GLUCOSAMINE 1500 COMPLEX) CAPS Take 1 capsule daily by mouth.    Marland Kitchen HYDROcodone-acetaminophen (NORCO) 10-325 MG tablet Take 1 tablet by mouth every 6 (six) hours as needed. 10 tablet 0  . hydrocortisone cream 1 % Apply 1 application daily as needed topically for itching.    . losartan (COZAAR) 50 MG tablet Take 50 mg by mouth daily.    . Multiple Vitamin (MULTIVITAMIN WITH MINERALS) TABS tablet Take 1 tablet daily by mouth.    . oxybutynin (DITROPAN) 5 MG tablet Take 5 mg daily by mouth.  0  . tamoxifen (NOLVADEX) 20 MG tablet Take 1 tablet (20 mg total) by mouth daily. 90 tablet 3   No current facility-administered medications for this visit.     PHYSICAL EXAMINATION: ECOG PERFORMANCE STATUS: 1 - Symptomatic but completely ambulatory  Vitals:   09/10/17 0919  BP: 134/63  Pulse: 85  Resp: 18  Temp: 98.5 F (36.9 C)  SpO2: 97%   Filed Weights    GENERAL:alert, no distress and comfortable SKIN: skin color, texture, turgor are normal, no rashes or significant lesions EYES: normal, Conjunctiva are pink and non-injected, sclera clear OROPHARYNX:no exudate, no erythema and lips, buccal mucosa, and tongue normal  NECK: supple, thyroid normal size, non-tender, without nodularity LYMPH:  no palpable lymphadenopathy in the cervical, axillary or inguinal LUNGS:  clear to auscultation and percussion with normal breathing effort HEART: regular rate & rhythm and no murmurs and no lower extremity edema ABDOMEN:abdomen soft, non-tender and normal bowel sounds MUSCULOSKELETAL:no cyanosis of digits and no clubbing  NEURO: alert & oriented x 3 with fluent speech, no focal motor/sensory deficits EXTREMITIES: No lower extremity edema  LABORATORY DATA:  I have reviewed the data as listed CMP Latest Ref Rng & Units 03/27/2017  Glucose 65 - 99 mg/dL 115(H)  BUN 6 - 20 mg/dL 9  Creatinine 0.44 - 1.00 mg/dL 0.71  Sodium 135 - 145 mmol/L 140  Potassium 3.5 - 5.1 mmol/L 4.3  Chloride 101 - 111 mmol/L 107  CO2 22 - 32 mmol/L 26  Calcium 8.9 - 10.3 mg/dL 9.3    Lab Results  Component Value Date   WBC 7.5 03/27/2017   HGB 13.6 03/27/2017   HCT 43.6 03/27/2017   MCV 87.2 03/27/2017   PLT 290 03/27/2017    ASSESSMENT & PLAN:  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  I instructed her to her about hydration. I sent a refill of tamoxifen to a new [facility] store.  Return to clinic in 1 year for follow-up for breast exams   Orders Placed This Encounter  Procedures  . MM DIAG BREAST TOMO BILATERAL    Standing Status:   Future    Standing Expiration Date:   09/11/2018    Order Specific Question:   Reason for Exam (SYMPTOM  OR DIAGNOSIS REQUIRED)    Answer:   Annual mammograms with H/O breast cancer    Order Specific Question:   Is the patient pregnant?    Answer:   No    Order Specific Question:   Preferred imaging location?    Answer:    [facility]   The patient has a good understanding of the overall plan. she agrees with it. she will call with any problems that may develop before the next visit here.   Harriette Ohara, MD 09/10/17

## 2017-10-22 DIAGNOSIS — E782 Mixed hyperlipidemia: Secondary | ICD-10-CM | POA: Diagnosis not present

## 2017-10-22 DIAGNOSIS — Z853 Personal history of malignant neoplasm of breast: Secondary | ICD-10-CM | POA: Diagnosis not present

## 2017-10-22 DIAGNOSIS — I10 Essential (primary) hypertension: Secondary | ICD-10-CM | POA: Diagnosis not present

## 2017-10-22 DIAGNOSIS — Z0000 Encounter for general adult medical examination without abnormal findings: Secondary | ICD-10-CM | POA: Diagnosis not present

## 2017-12-09 ENCOUNTER — Ambulatory Visit
Admission: RE | Admit: 2017-12-09 | Discharge: 2017-12-09 | Disposition: A | Discharge status: Home / Self Care | Payer: BLUE CROSS/BLUE SHIELD | Source: Ambulatory Visit | Attending: Hematology and Oncology | Admitting: Hematology and Oncology

## 2017-12-09 DIAGNOSIS — R922 Inconclusive mammogram: Secondary | ICD-10-CM | POA: Diagnosis not present

## 2017-12-09 DIAGNOSIS — D0511 Intraductal carcinoma in situ of right breast: Secondary | ICD-10-CM

## 2017-12-24 IMAGING — MR MR BILATERAL BREAST WITHOUT AND WITH CONTRAST
8 of 12 series · 29 of 48 positions shown · IV contrast (20ml Multihance)
Comparison: Previous exam(s).

CLINICAL DATA: Patient has a history of right breast cancer
lumpectomy with right breast radiation at age 37. The patient had a
recent biopsy of the right breast which showed atypical ductal
hyperplasia and about surgical excision of this was performed in
February 2017. In the specimen of the surgical excision [DATE] cm of DCIS intermediate grade was found. The distance to the
closest margin was 0.3 cm from lateral margin.

LABS:  Creatinine was obtained on site at [HOSPITAL] at [REDACTED] [HOSPITAL].
Results: Creatinine 0.7 mg/dL.
EXAM:
BILATERAL BREAST MRI WITH AND WITHOUT CONTRAST
TECHNIQUE: Multiplanar, multisequence MR images of both breasts were obtained
prior to and following the intravenous administration of 20 ml of
MultiHance.

[Series 2: t2_tirm_tra ipat (a-p) · axial · 3.0mm · 0.70mm/px · 1 of 76 slices shown]
[im 1/76]
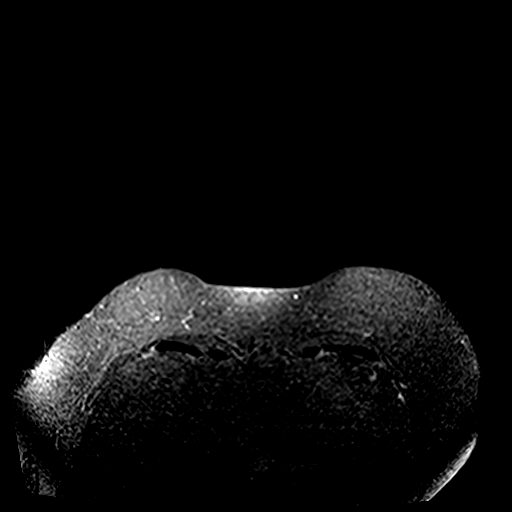

[Series 3: fl3d pre-cm no · axial · non-contrast · 0.9mm · 0.94mm/px · z∈[-93,+137]mm · 5 of 256 slices shown]
[im 1/256]
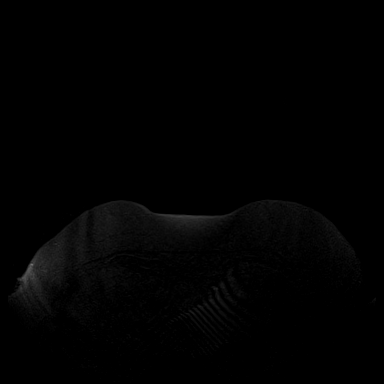
[im 64/256]
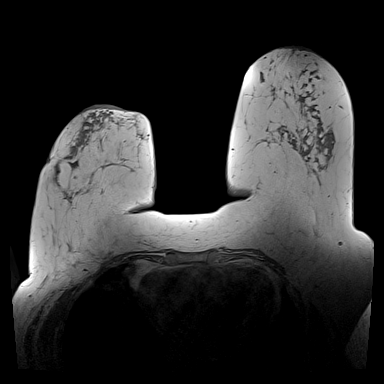
[im 128/256]
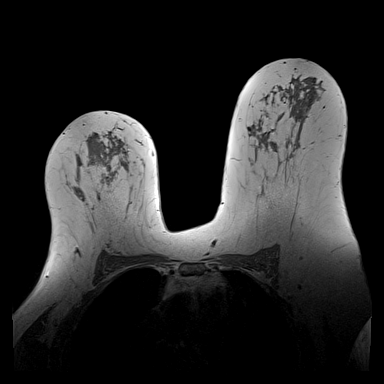
[im 192/256]
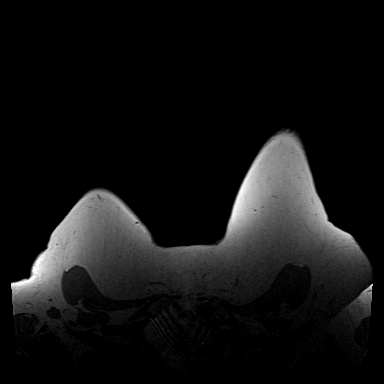
[im 256/256]
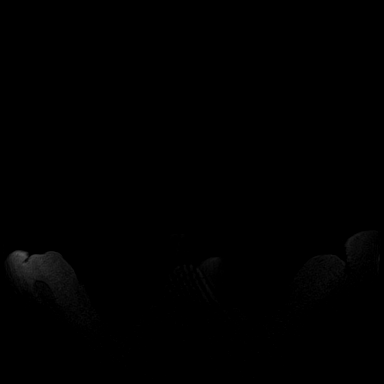

[Series 4: fl3d pre-cm · axial · non-contrast · 0.9mm · 0.87mm/px · z∈[-93,+137]mm · 5 of 256 slices shown]
[im 1/256]
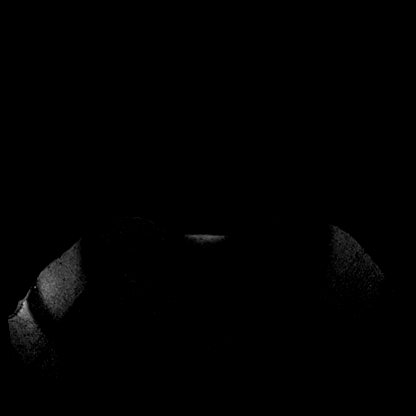
[im 64/256]
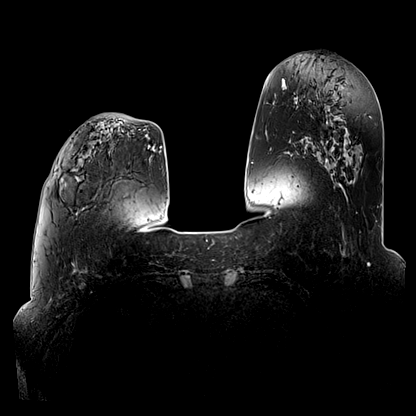
[im 128/256]
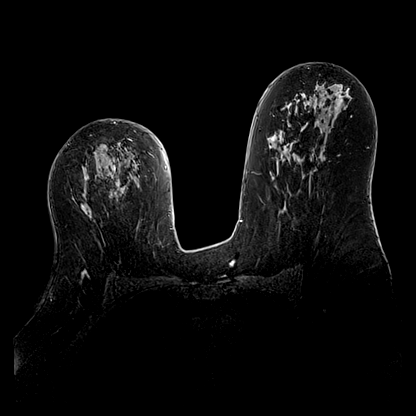
[im 192/256]
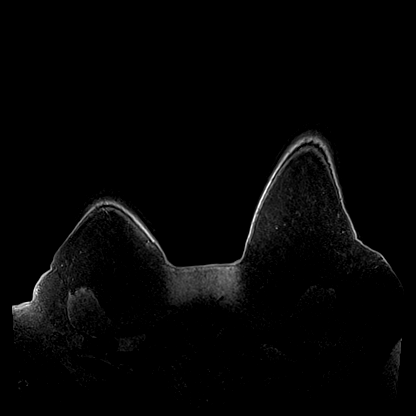
[im 256/256]
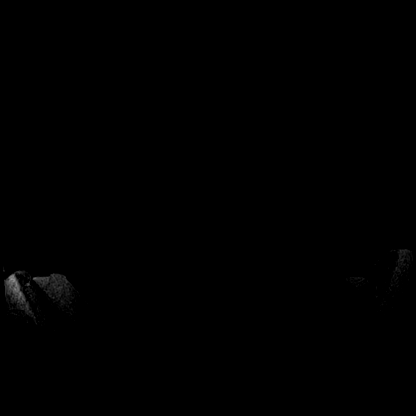

[Series 5: fl3d post-cm 20 · axial · 0.9mm · 0.87mm/px · z∈[-93,+137]mm · 5 of 256 slices shown (1 of 3)]
[im 1/256]
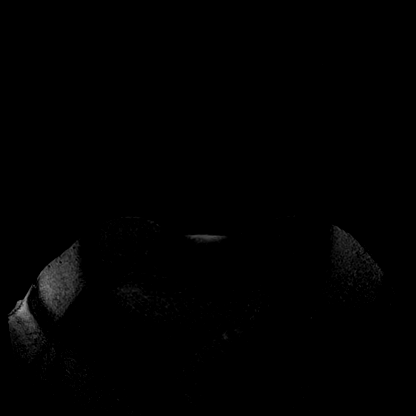
[im 64/256]
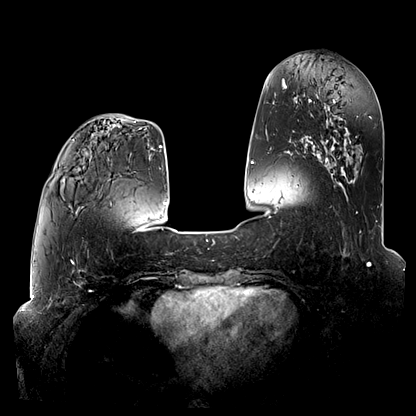
[im 128/256]
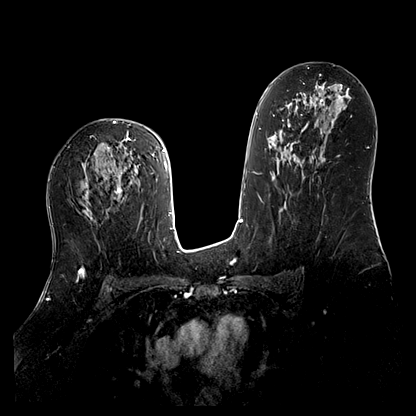
[im 192/256]
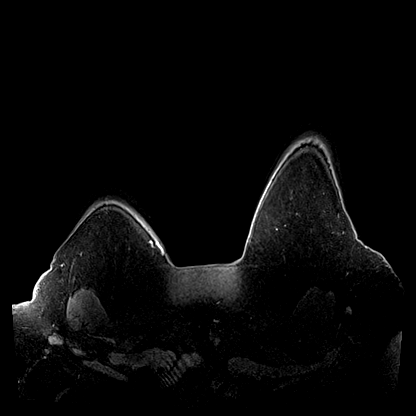
[im 256/256]
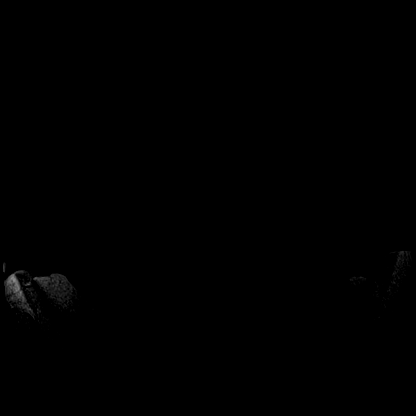

[Series 6: fl3d post-cm 20 · axial · 0.9mm · 0.87mm/px · z∈[-93,+137]mm · 5 of 256 slices shown (2 of 3)]
[im 1/256]
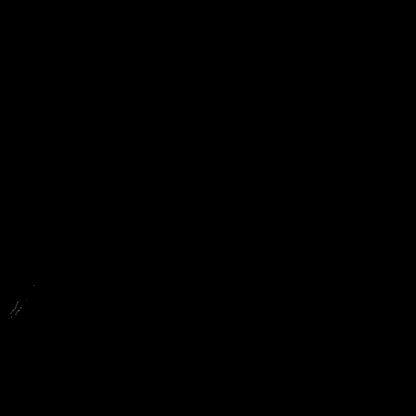
[im 64/256]
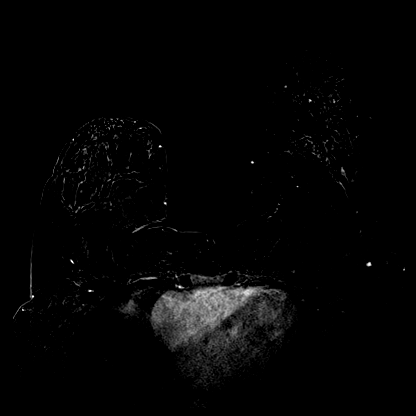
[im 128/256]
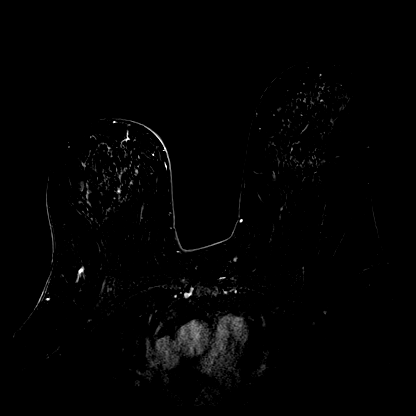
[im 192/256]
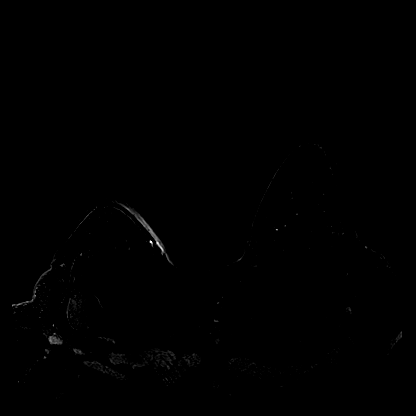
[im 256/256]
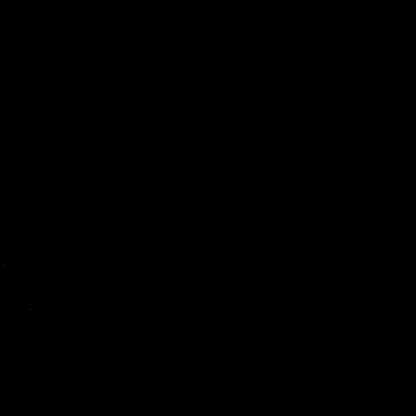

[Series 7: fl3d post-cm 20 · axial · 230.4mm · 0.87mm/px · 1 of 1 slices shown (3 of 3)]
[im 1/1]
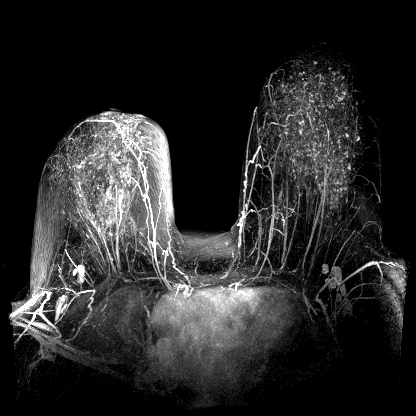

[Series 8: fl3d post-cm 3min · axial · 0.9mm · 0.87mm/px · z∈[-93,+137]mm · 6 of 256 slices shown]
[im 1/256]
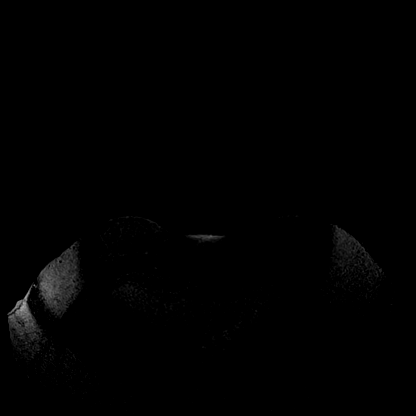
[im 52/256]
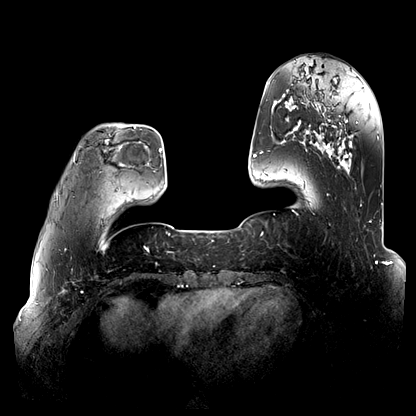
[im 103/256]
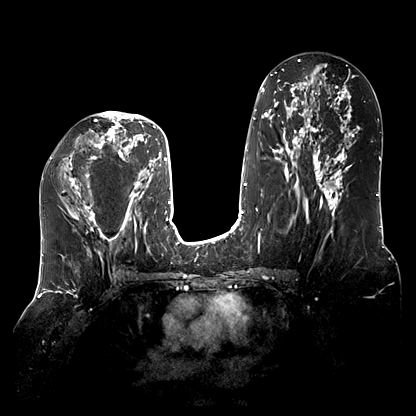
[im 154/256]
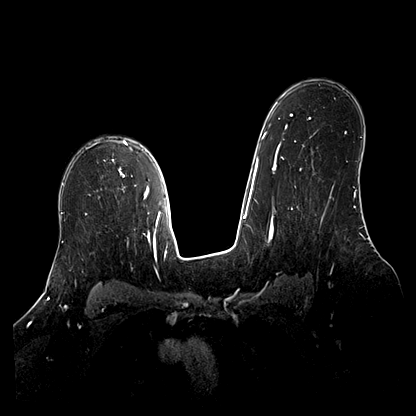
[im 205/256]
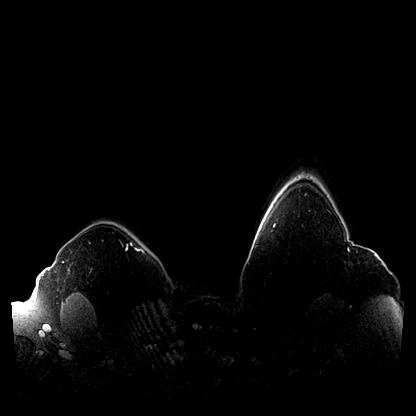
[im 256/256]
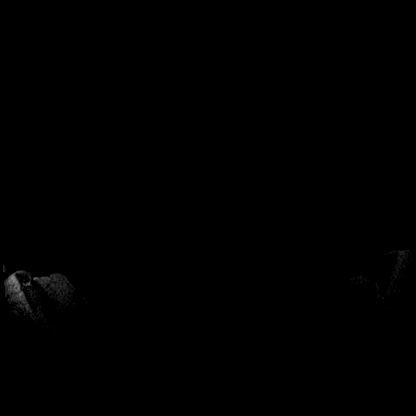

[Series 9: fl3d post-cm 3min_sub · axial · 0.9mm · 0.87mm/px · 1 of 256 slices shown]
[im 1/256]
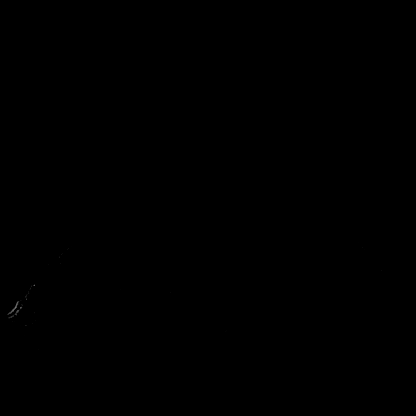

[29 of 48 positions shown; findings below may reference images not displayed]

THREE-DIMENSIONAL MR IMAGE RENDERING ON INDEPENDENT WORKSTATION:

Three-dimensional MR images were rendered by post-processing of the
original MR data on an independent workstation. The
three-dimensional MR images were interpreted, and findings are
reported in the following complete MRI report for this study. Three
dimensional images were evaluated at the independent [employer]
workstation
FINDINGS: Breast composition: c. Heterogeneous fibroglandular tissue.

Background parenchymal enhancement: Marked.

Right breast: Postsurgical seroma is identified within the central
right breast. There is generalized enhancement surrounding the
seroma, felt related to recent postsurgical change. No focal
discrete abnormal enhancing mass is noted.

Left breast: There is an irregular mass in the upper medial left
breast middle depth measuring 1.5 x 0.5 x 0.7 cm with washout
enhancement kinetics. Image 669, series [DATE] minute post-contrast.

Lymph nodes: There is a 1.2 x 1.5 cm lymph node at the left axillary
tail which does not have clear focal notched fat.

Ancillary findings:  None.
IMPRESSION: Suspicious findings.

RECOMMENDATION:
Recommend MRI guided core biopsy of the left breast mass. Also
recommend second-look ultrasound with possible biopsy of the left
axillary lymph node.

Generalized diffuse enhancement surrounding the postsurgical seroma
of the right breast, felt related to recent postsurgical change,
felt less likely to represent residual DCIS. Recommend 3 month
follow-up MRI of right breast.

BI-RADS CATEGORY  4: Suspicious.

## 2018-01-21 IMAGING — MG BREAST SURGICAL SPECIMEN
1 series · 1 of 1 positions shown · non-contrast
Comparison: Previous exam(s).

CLINICAL DATA: Post left breast excision.

EXAM:
SPECIMEN RADIOGRAPH OF THE LEFT BREAST

[L]
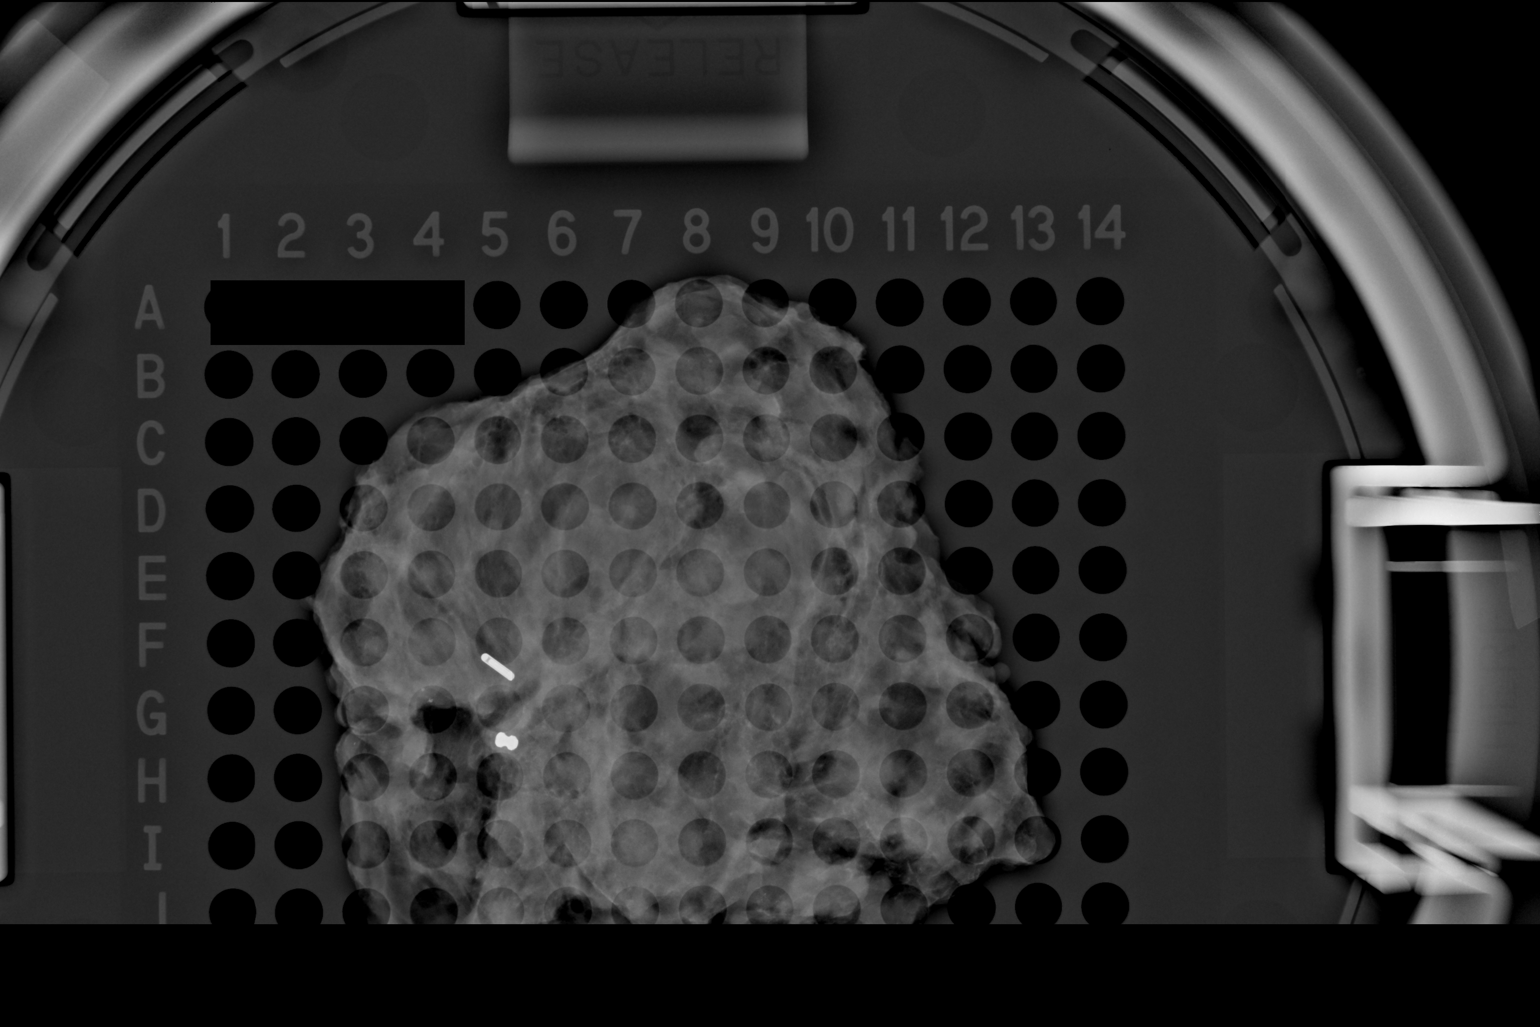

[1 of 1 positions shown; findings below may reference images not displayed]

FINDINGS: Status post excision of the left breast. The radioactive seed and
biopsy marker clip are present, completely intact, and were marked
for pathology.
IMPRESSION: Specimen radiograph of the left breast.

## 2018-09-09 NOTE — Assessment & Plan Note (Deleted)
Ductal carcinoma in situ (DCIS) of right breast 1998: Right breast DCIS treated with lumpectomy and radiation 02/16/2017:Right lumpectomy: DCIS with calcifications 1.5 cm, margins negative, ER 100%, PR 100%, Tis NX stage 0 No role of radiation because she had previous radiation therapy  Current treatment: Tamoxifen 20 mg daily started 03/02/2017 Tamoxifen toxicities: Severe muscle cramps intermittently.  I instructed her to her about hydration. I sent a refill of tamoxifen to a new [facility] store.  Breast cancer surveillance: Mammogram 12/09/2017: Benign, breast density category C Return to clinic in 1 year for follow-up

## 2018-09-10 ENCOUNTER — Telehealth: Payer: Self-pay | Admitting: Hematology and Oncology

## 2018-09-10 NOTE — Telephone Encounter (Signed)
Called regarding upcoming [facility] appointment, patient would like this appointment to be cancelled due to conflict of another appointment. Patient will call back when ready to reschedule.   Message to provider.

## 2018-09-13 ENCOUNTER — Inpatient Hospital Stay: Payer: BLUE CROSS/BLUE SHIELD | Admitting: Hematology and Oncology

## 2018-09-13 DIAGNOSIS — D0511 Intraductal carcinoma in situ of right breast: Principal | ICD-10-CM

## 2018-11-03 ENCOUNTER — Other Ambulatory Visit: Payer: Self-pay | Admitting: Hematology and Oncology

## 2018-11-03 DIAGNOSIS — Z9889 Other specified postprocedural states: Secondary | ICD-10-CM

## 2018-11-05 ENCOUNTER — Other Ambulatory Visit: Payer: Self-pay | Admitting: Hematology and Oncology

## 2018-11-05 DIAGNOSIS — D0511 Intraductal carcinoma in situ of right breast: Secondary | ICD-10-CM

## 2018-11-11 ENCOUNTER — Telehealth: Payer: Self-pay

## 2018-11-11 NOTE — Telephone Encounter (Signed)
Pt. Called stated that her pharmacy was trying to reach Dr. Lindi Adie in reference to her medication tamoxifen. Called [facility] pharmacy in to inquire about Pt's  Prescription. Was told prescription was ready to be picked up. Pt. Notified to pick up her prescription.

## 2018-12-01 DIAGNOSIS — Z853 Personal history of malignant neoplasm of breast: Secondary | ICD-10-CM | POA: Diagnosis not present

## 2018-12-01 DIAGNOSIS — I10 Essential (primary) hypertension: Secondary | ICD-10-CM | POA: Diagnosis not present

## 2018-12-01 DIAGNOSIS — R7301 Impaired fasting glucose: Secondary | ICD-10-CM | POA: Diagnosis not present

## 2018-12-01 DIAGNOSIS — E782 Mixed hyperlipidemia: Secondary | ICD-10-CM | POA: Diagnosis not present

## 2018-12-15 ENCOUNTER — Other Ambulatory Visit: Payer: Self-pay

## 2018-12-15 ENCOUNTER — Ambulatory Visit
Admission: RE | Admit: 2018-12-15 | Discharge: 2018-12-15 | Disposition: A | Discharge status: Home / Self Care | Payer: BLUE CROSS/BLUE SHIELD | Source: Ambulatory Visit | Attending: Hematology and Oncology | Admitting: Hematology and Oncology

## 2018-12-15 DIAGNOSIS — Z9889 Other specified postprocedural states: Secondary | ICD-10-CM

## 2018-12-15 DIAGNOSIS — R922 Inconclusive mammogram: Secondary | ICD-10-CM | POA: Diagnosis not present

## 2018-12-17 DIAGNOSIS — E782 Mixed hyperlipidemia: Secondary | ICD-10-CM | POA: Diagnosis not present

## 2018-12-17 DIAGNOSIS — I10 Essential (primary) hypertension: Secondary | ICD-10-CM | POA: Diagnosis not present

## 2018-12-17 DIAGNOSIS — R7301 Impaired fasting glucose: Secondary | ICD-10-CM | POA: Diagnosis not present

## 2019-02-04 ENCOUNTER — Other Ambulatory Visit: Payer: Self-pay | Admitting: Hematology and Oncology

## 2019-02-04 DIAGNOSIS — D0511 Intraductal carcinoma in situ of right breast: Secondary | ICD-10-CM

## 2019-05-07 ENCOUNTER — Other Ambulatory Visit: Payer: Self-pay | Admitting: Hematology and Oncology

## 2019-05-07 DIAGNOSIS — D0511 Intraductal carcinoma in situ of right breast: Secondary | ICD-10-CM

## 2019-06-04 ENCOUNTER — Other Ambulatory Visit: Payer: Self-pay | Admitting: Hematology and Oncology

## 2019-06-04 DIAGNOSIS — D0511 Intraductal carcinoma in situ of right breast: Secondary | ICD-10-CM

## 2019-06-10 ENCOUNTER — Other Ambulatory Visit: Payer: Self-pay | Admitting: *Deleted

## 2019-06-10 DIAGNOSIS — D0511 Intraductal carcinoma in situ of right breast: Secondary | ICD-10-CM

## 2019-06-10 MED ORDER — TAMOXIFEN CITRATE 20 MG PO TABS: ORAL_TABLET | ORAL | 0 refills | Status: DC

## 2019-06-27 NOTE — Progress Notes (Signed)
Patient Care Team: Hulan Fess, MD as PCP - General (Family Medicine)  DIAGNOSIS:    ICD-10-CM   1. Ductal carcinoma in situ (DCIS) of right breast  D05.11     SUMMARY OF ONCOLOGIC HISTORY: Oncology History  Ductal carcinoma in situ (DCIS) of right breast  1998 Surgery   Right breast DCIS treated with lumpectomy and radiation   12/17/2016 Initial Diagnosis   Ductal carcinoma in situ (DCIS) of right breast   02/16/2017 Surgery   Right lumpectomy: DCIS with calcifications 1.5 cm, margins negative, ER 100%, PR 100%, Tis NX stage 0   03/02/2017 -  Anti-estrogen oral therapy   Adjuvant tamoxifen 20 mg daily x5 years     CHIEF COMPLIANT: Follow-up of right breast DCIS on tamoxifen therapy  INTERVAL HISTORY: Destiny Barry is a 59 y.o. with above-mentioned history of  right breast DCIS who underwent a lumpectomy and is currently on antiestrogen therapy with tamoxifen. I last saw her over 1.5 years ago. Mammogram on 12/15/18 showed no evidence of malignancy bilaterally. She presents to the clinic today for follow-up.  Other than occasional muscle cramps she is tolerating the tamoxifen extremely well.  She denies any lumps or nodules in the breast.  ALLERGIES:  is allergic to tramadol.  MEDICATIONS:  Current Outpatient Medications  Medication Sig Dispense Refill  . acetaminophen (TYLENOL) 500 MG tablet Take 1,000 mg 2 (two) times daily as needed by mouth for moderate pain or headache.    Marland Kitchen amLODipine (NORVASC) 2.5 MG tablet Take 2.5 mg daily by mouth.  0  . Cholecalciferol (VITAMIN D) 2000 units CAPS Take 2,000 Units daily by mouth.    . Cyanocobalamin (B-12) 5000 MCG CAPS Take 5,000 mcg daily by mouth.    . Glucosamine-Chondroit-Vit C-Mn (GLUCOSAMINE 1500 COMPLEX) CAPS Take 1 capsule daily by mouth.    . hydrocortisone cream 1 % Apply 1 application daily as needed topically for itching.    . losartan (COZAAR) 50 MG tablet Take 50 mg by mouth daily.    . Multiple Vitamin  (MULTIVITAMIN WITH MINERALS) TABS tablet Take 1 tablet daily by mouth.    . oxybutynin (DITROPAN) 5 MG tablet Take 5 mg daily by mouth.  0  . tamoxifen (NOLVADEX) 20 MG tablet TAKE 1 TABLET(20 MG) BY MOUTH DAILY 30 tablet 0   No current facility-administered medications for this visit.    PHYSICAL EXAMINATION: ECOG PERFORMANCE STATUS: 1 - Symptomatic but completely ambulatory  Vitals:   06/28/19 0921  BP: (!) 160/71  Pulse: 94  Resp: 20  Temp: 98.2 F (36.8 C)  SpO2: 100%   Filed Weights   06/28/19 0921  Weight: 246 lb 11.2 oz (111.9 kg)    BREAST: No palpable masses or nodules in either right or left breasts. No palpable axillary supraclavicular or infraclavicular adenopathy no breast tenderness or nipple discharge. 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  I instructed her to her about hydration. I sent a refill of tamoxifen.  Breast cancer surveillance: 1. Breast exam: 06/28/19 2. Mammograms: 12/15/2018:  Return  to clinic in 1 year for follow-up for breast exams     No orders of the defined types were placed in this encounter.  The patient has a good understanding of the overall plan. she agrees with it. she will call with any problems that may develop before the next visit here.  Total time spent: 20 mins including face to face time and time spent for planning, charting and coordination of care  Nicholas Lose, MD 06/28/2019  I, Cloyde Reams Dorshimer, am acting as scribe for Dr. Nicholas Lose.  I have reviewed the above documentation for accuracy and completeness, and I agree with the above.

## 2019-06-28 ENCOUNTER — Inpatient Hospital Stay: Payer: BC Managed Care – PPO | Attending: Hematology and Oncology | Admitting: Hematology and Oncology

## 2019-06-28 ENCOUNTER — Other Ambulatory Visit: Payer: Self-pay

## 2019-06-28 DIAGNOSIS — D0511 Intraductal carcinoma in situ of right breast: Secondary | ICD-10-CM | POA: Diagnosis not present

## 2019-06-28 DIAGNOSIS — Z79810 Long term (current) use of selective estrogen receptor modulators (SERMs): Secondary | ICD-10-CM | POA: Diagnosis not present

## 2019-06-28 DIAGNOSIS — R252 Cramp and spasm: Secondary | ICD-10-CM | POA: Insufficient documentation

## 2019-06-28 DIAGNOSIS — Z170 Estrogen receptor positive status [ER+]: Secondary | ICD-10-CM | POA: Insufficient documentation

## 2019-06-28 DIAGNOSIS — Z923 Personal history of irradiation: Secondary | ICD-10-CM | POA: Diagnosis not present

## 2019-06-28 DIAGNOSIS — Z79899 Other long term (current) drug therapy: Secondary | ICD-10-CM | POA: Insufficient documentation

## 2019-06-28 MED ORDER — TAMOXIFEN CITRATE 20 MG PO TABS: ORAL_TABLET | ORAL | 3 refills | Status: DC

## 2019-06-28 NOTE — Assessment & Plan Note (Signed)
Ductal carcinoma in situ (DCIS) of right breast 1998: Right breast DCIS treated with lumpectomy and radiation 02/16/2017:Right lumpectomy: DCIS with calcifications 1.5 cm, margins negative, ER 100%, PR 100%, Tis NX stage 0 No role of radiation because she had previous radiation therapy  Current treatment: Tamoxifen 20 mg daily started 03/02/2017  Tamoxifen toxicities: Severe muscle cramps intermittently.  I instructed her to her about hydration. I sent a refill of tamoxifen to a new [facility] store.  Breast cancer surveillance: 1. Breast exam: 06/28/19 2. Mammograms: 12/15/2018:  Return to clinic in 1 year for follow-up for breast exams

## 2019-07-10 ENCOUNTER — Other Ambulatory Visit: Payer: Self-pay | Admitting: Hematology and Oncology

## 2019-07-10 DIAGNOSIS — D0511 Intraductal carcinoma in situ of right breast: Secondary | ICD-10-CM

## 2019-11-10 ENCOUNTER — Other Ambulatory Visit: Payer: Self-pay | Admitting: Hematology and Oncology

## 2019-11-10 DIAGNOSIS — D0511 Intraductal carcinoma in situ of right breast: Secondary | ICD-10-CM

## 2019-12-15 DIAGNOSIS — D649 Anemia, unspecified: Secondary | ICD-10-CM | POA: Diagnosis not present

## 2019-12-15 DIAGNOSIS — E559 Vitamin D deficiency, unspecified: Secondary | ICD-10-CM | POA: Diagnosis not present

## 2019-12-15 DIAGNOSIS — R7301 Impaired fasting glucose: Secondary | ICD-10-CM | POA: Diagnosis not present

## 2019-12-15 DIAGNOSIS — I10 Essential (primary) hypertension: Secondary | ICD-10-CM | POA: Diagnosis not present

## 2019-12-15 DIAGNOSIS — E782 Mixed hyperlipidemia: Secondary | ICD-10-CM | POA: Diagnosis not present

## 2019-12-15 DIAGNOSIS — Z0000 Encounter for general adult medical examination without abnormal findings: Secondary | ICD-10-CM | POA: Diagnosis not present

## 2019-12-16 ENCOUNTER — Other Ambulatory Visit: Payer: Self-pay

## 2019-12-16 ENCOUNTER — Ambulatory Visit
Admission: RE | Admit: 2019-12-16 | Discharge: 2019-12-16 | Disposition: A | Discharge status: Home / Self Care | Payer: BC Managed Care – PPO | Source: Ambulatory Visit | Attending: Hematology and Oncology | Admitting: Hematology and Oncology

## 2019-12-16 DIAGNOSIS — Z853 Personal history of malignant neoplasm of breast: Secondary | ICD-10-CM | POA: Diagnosis not present

## 2019-12-16 DIAGNOSIS — D0511 Intraductal carcinoma in situ of right breast: Secondary | ICD-10-CM

## 2019-12-26 ENCOUNTER — Ambulatory Visit (INDEPENDENT_AMBULATORY_CARE_PROVIDER_SITE_OTHER): Payer: BC Managed Care – PPO | Admitting: Family Medicine

## 2019-12-26 ENCOUNTER — Ambulatory Visit: Payer: Self-pay

## 2019-12-26 ENCOUNTER — Encounter: Payer: Self-pay | Admitting: Family Medicine

## 2019-12-26 ENCOUNTER — Other Ambulatory Visit: Payer: Self-pay

## 2019-12-26 DIAGNOSIS — M25561 Pain in right knee: Secondary | ICD-10-CM | POA: Diagnosis not present

## 2019-12-26 MED ORDER — BACLOFEN 10 MG PO TABS: 5.0000 mg | ORAL_TABLET | Freq: Every evening | ORAL | 3 refills | Status: DC | PRN

## 2019-12-26 MED ORDER — MELOXICAM 15 MG PO TABS: 7.5000 mg | ORAL_TABLET | Freq: Every day | ORAL | 6 refills | Status: AC | PRN

## 2019-12-26 NOTE — Progress Notes (Signed)
Pain for over a month No known injury Not taking anything for pain Using topical creams for pain

## 2019-12-26 NOTE — Progress Notes (Signed)
Office Visit Note   Patient: Destiny Barry           Date of Birth: 01-07-1961           MRN: [id number] Visit Date: 12/26/2019 Requested by: Hulan Fess, MD [place],  [place] [id number] PCP: Hulan Fess, MD  Subjective: Chief Complaint  Patient presents with  . Right Knee - Pain    HPI: She is here with right knee pain.  Symptoms started about a month ago.  She has been walking for exercise in order to lose weight.  She is lost roughly 20 pounds already.  She walks about 10,000 steps per day.  Gradual onset of pain in the right knee, with a little bit of pain on the left.  Popping.  Cracking sounds, but no locking.  She has tried Tylenol with minimal improvement.  No previous significant problems with her knees.              ROS: No fevers or chills.  All other systems were reviewed and are negative.  Objective: Vital Signs: There were no vitals taken for this visit.  Physical Exam:  General:  Alert and oriented, in no acute distress. Pulm:  Breathing unlabored. Psy:  Normal mood, congruent affect. Skin: No erythema Knees: 2+ patellofemoral crepitus in both knees.  1+ effusion bilaterally with no warmth.  No pain with patella compression or apprehension.  Lachman's is solid, no laxity with varus or valgus stress bilaterally.  No significant joint line tenderness in either knee.  McMurray's is equivocal.  Imaging: XR KNEE 3 VIEW RIGHT  Result Date: 12/26/2019 X-Rays of knees reveal bilateral medial compartment joint space narrowing, left greater than right.  Bilateral moderate-severe patellofemoral DJD with lateral patella tilt and periarticular spurring.  No definite loose body.  No sign of stress fracture or neoplasm.   Assessment & Plan: 1.  Right greater than left knee pain probably due to osteoarthritis. -We will try meloxicam as needed.  She already takes glucosamine and turmeric. -If symptoms persist we will consider aspiration and  injection with cortisone or possibly gel injections.     Procedures: No procedures performed  No notes on file     PMFS History: Patient Active Problem List   Diagnosis Date Noted  . Ductal carcinoma in situ (DCIS) of right breast 03/02/2017  . Genetic testing 01/29/2017  . Family history of breast cancer    Past Medical History:  Diagnosis Date  . Cancer ([place])   . Family history of breast cancer   . Genetic testing 01/29/2017   Common Cancers panel (46 genes) @ [facility] - No pathogenic mutations detected  . Hypertension   . Personal history of radiation therapy    age 7; rt breast    Family History  Problem Relation Age of Onset  . Breast cancer Sister 44       Leukemia at 90; currently 27  . Melanoma Father        dx 70s on leg; currently 69  . Colon cancer Maternal Aunt        dx 34s; currently 90s  . Colon cancer Maternal Uncle        dx 64s; deceased 59s  . Colon cancer Cousin        son of mat uncle with CRC; dx 84s    Past Surgical History:  Procedure Laterality Date  . ABDOMINAL HYSTERECTOMY    . BREAST EXCISIONAL BIOPSY Left  2018 Benign  . BREAST LUMPECTOMY Right    1998 Malignant   . BREAST LUMPECTOMY Right    2018 Malignant  . RADIOACTIVE SEED GUIDED EXCISIONAL BREAST BIOPSY Right 02/16/2017   Procedure: RIGHT BREAST SEED GUIDED EXCISIONAL BIOPSY;  Surgeon: Rolm Bookbinder, MD;  Location: [facility];  Service: General;  Laterality: Right;  . RADIOACTIVE SEED GUIDED EXCISIONAL BREAST BIOPSY Left 04/01/2017   Procedure: RADIOACTIVE SEED GUIDED EXCISIONAL LEFT BREAST BIOPSY;  Surgeon: Rolm Bookbinder, MD;  Location: [facility];  Service: General;  Laterality: Left;   Social History   Occupational History  . Not on file  Tobacco Use  . Smoking status: Never Smoker  . Smokeless tobacco: Never Used  Vaping Use  . Vaping Use: Never assessed  Substance and Sexual Activity  . Alcohol use: Yes    Comment: 1-2  . Drug use: No  .  Sexual activity: Not on file

## 2020-05-17 DIAGNOSIS — Z1159 Encounter for screening for other viral diseases: Secondary | ICD-10-CM | POA: Diagnosis not present

## 2020-06-27 ENCOUNTER — Ambulatory Visit: Payer: BC Managed Care – PPO | Admitting: Hematology and Oncology

## 2020-06-28 ENCOUNTER — Ambulatory Visit: Payer: BC Managed Care – PPO | Admitting: Hematology and Oncology

## 2020-07-10 NOTE — Progress Notes (Signed)
Patient Care Team: Hulan Fess, MD as PCP - General (Family Medicine)  DIAGNOSIS:    ICD-10-CM   1. Ductal carcinoma in situ (DCIS) of right breast  D05.11     SUMMARY OF ONCOLOGIC HISTORY: Oncology History  Ductal carcinoma in situ (DCIS) of right breast  1998 Surgery   Right breast DCIS treated with lumpectomy and radiation   12/17/2016 Initial Diagnosis   Ductal carcinoma in situ (DCIS) of right breast   02/16/2017 Surgery   Right lumpectomy: DCIS with calcifications 1.5 cm, margins negative, ER 100%, PR 100%, Tis NX stage 0   03/02/2017 -  Anti-estrogen oral therapy   Adjuvant tamoxifen 20 mg daily x5 years     CHIEF COMPLIANT: Follow-up of right breast DCIS on tamoxifen therapy  INTERVAL HISTORY: Destiny Barry is a 60 y.o. with above-mentioned history of right breast DCIS who underwent a lumpectomy and is currently on antiestrogen therapy with tamoxifen. Mammogram on 12/16/19 showed no evidence of malignancy bilaterally. She presents to the clinic today for follow-up.  She has intermittent leg cramps but otherwise no major issues.  ALLERGIES:  is allergic to tramadol.  MEDICATIONS:  Current Outpatient Medications  Medication Sig Dispense Refill  . acetaminophen (TYLENOL) 500 MG tablet Take 1,000 mg 2 (two) times daily as needed by mouth for moderate pain or headache. (Patient not taking: Reported on 12/26/2019)    . amLODipine (NORVASC) 2.5 MG tablet Take 2.5 mg daily by mouth.  0  . baclofen (LIORESAL) 10 MG tablet Take 0.5-1 tablets (5-10 mg total) by mouth at bedtime as needed for muscle spasms. 30 each 3  . Cholecalciferol (VITAMIN D) 2000 units CAPS Take 2,000 Units daily by mouth.    . Cyanocobalamin (B-12) 5000 MCG CAPS Take 5,000 mcg daily by mouth.    . Glucosamine-Chondroit-Vit C-Mn (GLUCOSAMINE 1500 COMPLEX) CAPS Take 1 capsule daily by mouth.    . losartan (COZAAR) 50 MG tablet Take 50 mg by mouth daily.    . meloxicam (MOBIC) 15 MG tablet Take  0.5-1 tablets (7.5-15 mg total) by mouth daily as needed for pain. 30 tablet 6  . Multiple Vitamin (MULTIVITAMIN WITH MINERALS) TABS tablet Take 1 tablet daily by mouth.    . oxybutynin (DITROPAN) 5 MG tablet Take 5 mg daily by mouth.  0  . tamoxifen (NOLVADEX) 20 MG tablet TAKE 1 TABLET(20 MG) BY MOUTH DAILY 90 tablet 0   No current facility-administered medications for this visit.    PHYSICAL EXAMINATION: ECOG PERFORMANCE STATUS: 1 - Symptomatic but completely ambulatory  Vitals:   07/11/20 0949  BP: (!) 143/71  Pulse: 95  Resp: 20  Temp: (!) 97.5 F (36.4 C)  SpO2: 97%   Filed Weights   07/11/20 0949  Weight: 233 lb 6.4 oz (105.9 kg)       LABORATORY DATA:  I have reviewed the data as listed CMP Latest Ref Rng & Units 03/27/2017  Glucose 65 - 99 mg/dL 115(H)  BUN 6 - 20 mg/dL 9  Creatinine 0.44 - 1.00 mg/dL 0.71  Sodium 135 - 145 mmol/L 140  Potassium 3.5 - 5.1 mmol/L 4.3  Chloride 101 - 111 mmol/L 107  CO2 22 - 32 mmol/L 26  Calcium 8.9 - 10.3 mg/dL 9.3    Lab Results  Component Value Date   WBC 7.5 03/27/2017   HGB 13.6 03/27/2017   HCT 43.6 03/27/2017   MCV 87.2 03/27/2017   PLT 290 03/27/2017    ASSESSMENT & PLAN:  Ductal  carcinoma in situ (DCIS) of right breast 1998: Right breast DCIS treated with lumpectomy and radiation 02/16/2017:Right lumpectomy: DCIS with calcifications 1.5 cm, margins negative, ER 100%, PR 100%, Tis NX stage 0 No role of radiation because she had previous radiation therapy  Current treatment:Tamoxifen 20 mg daily started 03/02/2017  Tamoxifen toxicities: Severe muscle cramps intermittently. I sent a refill of tamoxifen.  Breast cancer surveillance: 1. Breast exam: Patient told me that she has them appointments with her physicians for breast exams next month and she would like to skip it this year. 2. Mammograms: 12/16/2019: Benign breast density category C  Return to clinic in1 yearfor follow-up for breast  exams    No orders of the defined types were placed in this encounter.  The patient has a good understanding of the overall plan. she agrees with it. she will call with any problems that may develop before the next visit here.  Total time spent: 20 mins including face to face time and time spent for planning, charting and coordination of care  Rulon Eisenmenger, MD, MPH 07/11/2020  I, Cloyde Reams Dorshimer, am acting as scribe for Dr. Nicholas Lose.  I have reviewed the above documentation for accuracy and completeness, and I agree with the above.

## 2020-07-11 ENCOUNTER — Inpatient Hospital Stay: Payer: BC Managed Care – PPO | Attending: Hematology and Oncology | Admitting: Hematology and Oncology

## 2020-07-11 ENCOUNTER — Other Ambulatory Visit: Payer: Self-pay

## 2020-07-11 DIAGNOSIS — D0511 Intraductal carcinoma in situ of right breast: Secondary | ICD-10-CM | POA: Diagnosis not present

## 2020-07-11 DIAGNOSIS — R252 Cramp and spasm: Secondary | ICD-10-CM | POA: Insufficient documentation

## 2020-07-11 DIAGNOSIS — Z79899 Other long term (current) drug therapy: Secondary | ICD-10-CM | POA: Insufficient documentation

## 2020-07-11 DIAGNOSIS — Z79810 Long term (current) use of selective estrogen receptor modulators (SERMs): Secondary | ICD-10-CM | POA: Diagnosis not present

## 2020-07-11 MED ORDER — TAMOXIFEN CITRATE 20 MG PO TABS: ORAL_TABLET | ORAL | 6 refills | Status: DC

## 2020-07-11 NOTE — Assessment & Plan Note (Signed)
1998: Right breast DCIS treated with lumpectomy and radiation 02/16/2017:Right lumpectomy: DCIS with calcifications 1.5 cm, margins negative, ER 100%, PR 100%, Tis NX stage 0 No role of radiation because she had previous radiation therapy  Current treatment:Tamoxifen 20 mg daily started 03/02/2017  Tamoxifen toxicities: Severe muscle cramps intermittently. I sent a refill of tamoxifen.  Breast cancer surveillance: 1. Breast exam: 07/11/2020: Benign 2. Mammograms: 12/16/2019: Benign breast density category C  Return to clinic in1 yearfor follow-up for breast exams

## 2020-11-07 ENCOUNTER — Other Ambulatory Visit: Payer: Self-pay | Admitting: Hematology and Oncology

## 2020-11-07 DIAGNOSIS — Z9889 Other specified postprocedural states: Secondary | ICD-10-CM

## 2020-12-18 ENCOUNTER — Ambulatory Visit
Admission: RE | Admit: 2020-12-18 | Discharge: 2020-12-18 | Disposition: A | Discharge status: Home / Self Care | Payer: BC Managed Care – PPO | Source: Ambulatory Visit | Attending: Hematology and Oncology | Admitting: Hematology and Oncology

## 2020-12-18 ENCOUNTER — Other Ambulatory Visit: Payer: Self-pay

## 2020-12-18 DIAGNOSIS — R922 Inconclusive mammogram: Secondary | ICD-10-CM | POA: Diagnosis not present

## 2020-12-18 DIAGNOSIS — Z9889 Other specified postprocedural states: Secondary | ICD-10-CM

## 2020-12-18 DIAGNOSIS — Z853 Personal history of malignant neoplasm of breast: Secondary | ICD-10-CM | POA: Diagnosis not present

## 2020-12-28 DIAGNOSIS — E782 Mixed hyperlipidemia: Secondary | ICD-10-CM | POA: Diagnosis not present

## 2020-12-28 DIAGNOSIS — I10 Essential (primary) hypertension: Secondary | ICD-10-CM | POA: Diagnosis not present

## 2020-12-28 DIAGNOSIS — E559 Vitamin D deficiency, unspecified: Secondary | ICD-10-CM | POA: Diagnosis not present

## 2020-12-28 DIAGNOSIS — Z0000 Encounter for general adult medical examination without abnormal findings: Secondary | ICD-10-CM | POA: Diagnosis not present

## 2020-12-28 DIAGNOSIS — R7301 Impaired fasting glucose: Secondary | ICD-10-CM | POA: Diagnosis not present

## 2021-09-25 ENCOUNTER — Other Ambulatory Visit: Payer: Self-pay | Admitting: Hematology and Oncology

## 2021-09-25 DIAGNOSIS — D0511 Intraductal carcinoma in situ of right breast: Secondary | ICD-10-CM

## 2021-10-08 ENCOUNTER — Telehealth: Payer: Self-pay | Admitting: Hematology and Oncology

## 2021-10-08 NOTE — Telephone Encounter (Signed)
.  Called patient to schedule appointment per 6/6 inbasket, patient is aware of date and time.

## 2021-10-30 NOTE — Progress Notes (Signed)
Patient Care Team: Pcp, No as PCP - General  DIAGNOSIS:  Encounter Diagnosis  Name Primary?   Ductal carcinoma in situ (DCIS) of right breast     SUMMARY OF ONCOLOGIC HISTORY: Oncology History  Ductal carcinoma in situ (DCIS) of right breast  1998 Surgery   Right breast DCIS treated with lumpectomy and radiation   12/17/2016 Initial Diagnosis   Ductal carcinoma in situ (DCIS) of right breast   02/16/2017 Surgery   Right lumpectomy: DCIS with calcifications 1.5 cm, margins negative, ER 100%, PR 100%, Tis NX stage 0   03/02/2017 - 11/13/2021 Anti-estrogen oral therapy   Adjuvant tamoxifen 20 mg daily       CHIEF COMPLIANT:  Follow-up of right breast DCIS on tamoxifen therapy  INTERVAL HISTORY: Destiny Barry is a 61 y.o. with above-mentioned history of right breast DCIS. She presents to the clinic today for a follow-up. States that she has some mild hot flashes. Complains of cramping in her side and in right leg. Denies pain and discomfort in breast. She is getting time for exercise.  ALLERGIES:  is allergic to tramadol.  MEDICATIONS:  Current Outpatient Medications  Medication Sig Dispense Refill   acetaminophen (TYLENOL) 500 MG tablet Take 1,000 mg 2 (two) times daily as needed by mouth for moderate pain or headache. (Patient not taking: Reported on 12/26/2019)     amLODipine (NORVASC) 2.5 MG tablet Take 2.5 mg daily by mouth.  0   baclofen (LIORESAL) 10 MG tablet Take 0.5-1 tablets (5-10 mg total) by mouth at bedtime as needed for muscle spasms. 30 each 3   Cholecalciferol (VITAMIN D) 2000 units CAPS Take 2,000 Units daily by mouth.     Cyanocobalamin (B-12) 5000 MCG CAPS Take 5,000 mcg daily by mouth.     Glucosamine-Chondroit-Vit C-Mn (GLUCOSAMINE 1500 COMPLEX) CAPS Take 1 capsule daily by mouth.     losartan (COZAAR) 50 MG tablet Take 50 mg by mouth daily.     meloxicam (MOBIC) 15 MG tablet Take 0.5-1 tablets (7.5-15 mg total) by mouth daily as needed for  pain. 30 tablet 6   Multiple Vitamin (MULTIVITAMIN WITH MINERALS) TABS tablet Take 1 tablet daily by mouth.     oxybutynin (DITROPAN) 5 MG tablet Take 5 mg daily by mouth.  0   No current facility-administered medications for this visit.    PHYSICAL EXAMINATION: ECOG PERFORMANCE STATUS: 1 - Symptomatic but completely ambulatory  Vitals:   11/13/21 0925  BP: (!) 157/70  Pulse: 94  Resp: 18  Temp: (!) 97.5 F (36.4 C)  SpO2: 97%   Filed Weights   11/13/21 0925  Weight: 241 lb 9.6 oz (109.6 kg)    BREAST: No palpable masses or nodules in either right or left breasts. No palpable axillary supraclavicular or infraclavicular adenopathy no breast tenderness or nipple discharge. (exam performed in the presence of a chaperone)  LABORATORY DATA:  I have reviewed the data as listed    Latest Ref Rng & Units 03/27/2017    8:29 AM  CMP  Glucose 65 - 99 mg/dL 115   BUN 6 - 20 mg/dL 9   Creatinine 0.44 - 1.00 mg/dL 0.71   Sodium 135 - 145 mmol/L 140   Potassium 3.5 - 5.1 mmol/L 4.3   Chloride 101 - 111 mmol/L 107   CO2 22 - 32 mmol/L 26   Calcium 8.9 - 10.3 mg/dL 9.3     Lab Results  Component Value Date   WBC 7.5 03/27/2017  HGB 13.6 03/27/2017   HCT 43.6 03/27/2017   MCV 87.2 03/27/2017   PLT 290 03/27/2017    ASSESSMENT & PLAN:  Ductal carcinoma in situ (DCIS) of right breast 1998: Right breast DCIS treated with lumpectomy and radiation 02/16/2017: Right lumpectomy: DCIS with calcifications 1.5 cm, margins negative, ER 100%, PR 100%, Tis NX stage 0 No role of radiation because she had previous radiation therapy   Current treatment: Tamoxifen 20 mg daily started 03/02/2017 completed 11/13/2021   Tamoxifen toxicities: Severe muscle cramps intermittently: Improved with baclofen Abdominal cramps     Breast cancer surveillance: 1. Breast exam: 11/13/2021: Benign. 2. Mammograms: 12/18/2020: Benign breast density category C   Return to clinic on an as-needed  basis    No orders of the defined types were placed in this encounter.  The patient has a good understanding of the overall plan. she agrees with it. she will call with any problems that may develop before the next visit here. Total time spent: 30 mins including face to face time and time spent for planning, charting and co-ordination of care   Harriette Ohara, MD 11/13/21    I Gardiner Coins am scribing for Dr. Lindi Adie  I have reviewed the above documentation for accuracy and completeness, and I agree with the above.

## 2021-11-13 ENCOUNTER — Other Ambulatory Visit: Payer: Self-pay

## 2021-11-13 ENCOUNTER — Inpatient Hospital Stay: Payer: BC Managed Care – PPO | Attending: Hematology and Oncology | Admitting: Hematology and Oncology

## 2021-11-13 DIAGNOSIS — D0511 Intraductal carcinoma in situ of right breast: Secondary | ICD-10-CM | POA: Diagnosis not present

## 2021-11-13 DIAGNOSIS — Z79899 Other long term (current) drug therapy: Secondary | ICD-10-CM | POA: Diagnosis not present

## 2021-11-13 DIAGNOSIS — Z79810 Long term (current) use of selective estrogen receptor modulators (SERMs): Secondary | ICD-10-CM | POA: Insufficient documentation

## 2021-11-13 MED ORDER — BACLOFEN 10 MG PO TABS: 5.0000 mg | ORAL_TABLET | Freq: Every evening | ORAL | 3 refills | Status: AC | PRN

## 2021-11-13 NOTE — Assessment & Plan Note (Addendum)
1998: Right breast DCIS treated with lumpectomy and radiation 02/16/2017:Right lumpectomy: DCIS with calcifications 1.5 cm, margins negative, ER 100%, PR 100%, Tis NX stage 0 No role of radiation because she had previous radiation therapy  Current treatment:Tamoxifen 20 mg daily started 03/02/2017 completed 11/13/2021  Tamoxifen toxicities: Severe muscle cramps intermittently: Improved with baclofen Abdominal cramps    Breast cancer surveillance: 1. Breast exam: 11/13/2021: Benign. 2. Mammograms: 12/18/2020: Benign breast density category C  Return to clinic on an as-needed basis

## 2021-11-26 ENCOUNTER — Other Ambulatory Visit: Payer: Self-pay | Admitting: Hematology and Oncology

## 2021-11-26 DIAGNOSIS — Z9889 Other specified postprocedural states: Secondary | ICD-10-CM

## 2021-12-19 ENCOUNTER — Ambulatory Visit
Admission: RE | Admit: 2021-12-19 | Discharge: 2021-12-19 | Disposition: A | Discharge status: Home / Self Care | Payer: BC Managed Care – PPO | Source: Ambulatory Visit | Attending: Hematology and Oncology | Admitting: Hematology and Oncology

## 2021-12-19 DIAGNOSIS — R922 Inconclusive mammogram: Secondary | ICD-10-CM | POA: Diagnosis not present

## 2021-12-19 DIAGNOSIS — Z9889 Other specified postprocedural states: Secondary | ICD-10-CM

## 2021-12-19 DIAGNOSIS — Z853 Personal history of malignant neoplasm of breast: Secondary | ICD-10-CM | POA: Diagnosis not present

## 2021-12-25 ENCOUNTER — Other Ambulatory Visit: Payer: Self-pay | Admitting: Hematology and Oncology

## 2021-12-25 DIAGNOSIS — D0511 Intraductal carcinoma in situ of right breast: Secondary | ICD-10-CM

## 2022-01-30 DIAGNOSIS — Z0001 Encounter for general adult medical examination with abnormal findings: Secondary | ICD-10-CM | POA: Diagnosis not present

## 2022-01-30 DIAGNOSIS — Z23 Encounter for immunization: Secondary | ICD-10-CM | POA: Diagnosis not present

## 2022-01-30 DIAGNOSIS — E782 Mixed hyperlipidemia: Secondary | ICD-10-CM | POA: Diagnosis not present

## 2022-01-30 DIAGNOSIS — I10 Essential (primary) hypertension: Secondary | ICD-10-CM | POA: Diagnosis not present

## 2022-01-30 DIAGNOSIS — G4730 Sleep apnea, unspecified: Secondary | ICD-10-CM | POA: Diagnosis not present

## 2022-01-30 DIAGNOSIS — E559 Vitamin D deficiency, unspecified: Secondary | ICD-10-CM | POA: Diagnosis not present

## 2022-02-10 DIAGNOSIS — G4719 Other hypersomnia: Secondary | ICD-10-CM | POA: Diagnosis not present

## 2022-03-12 DIAGNOSIS — H35373 Puckering of macula, bilateral: Secondary | ICD-10-CM | POA: Diagnosis not present

## 2022-03-19 DIAGNOSIS — G4733 Obstructive sleep apnea (adult) (pediatric): Secondary | ICD-10-CM | POA: Diagnosis not present

## 2022-03-20 DIAGNOSIS — G4733 Obstructive sleep apnea (adult) (pediatric): Secondary | ICD-10-CM | POA: Diagnosis not present

## 2022-04-10 DIAGNOSIS — G4733 Obstructive sleep apnea (adult) (pediatric): Secondary | ICD-10-CM | POA: Diagnosis not present

## 2022-05-11 DIAGNOSIS — G4733 Obstructive sleep apnea (adult) (pediatric): Secondary | ICD-10-CM | POA: Diagnosis not present

## 2022-12-04 ENCOUNTER — Other Ambulatory Visit: Payer: Self-pay | Admitting: Family Medicine

## 2022-12-04 DIAGNOSIS — Z1231 Encounter for screening mammogram for malignant neoplasm of breast: Secondary | ICD-10-CM

## 2023-01-09 ENCOUNTER — Ambulatory Visit
Admission: RE | Admit: 2023-01-09 | Discharge: 2023-01-09 | Disposition: A | Discharge status: Home / Self Care | Payer: Self-pay | Source: Ambulatory Visit | Attending: Family Medicine | Admitting: Family Medicine

## 2023-01-09 DIAGNOSIS — Z1231 Encounter for screening mammogram for malignant neoplasm of breast: Secondary | ICD-10-CM

## 2023-02-16 ENCOUNTER — Other Ambulatory Visit (HOSPITAL_COMMUNITY): Payer: Self-pay

## 2024-01-05 ENCOUNTER — Other Ambulatory Visit: Payer: Self-pay | Admitting: Family Medicine

## 2024-01-05 DIAGNOSIS — Z1231 Encounter for screening mammogram for malignant neoplasm of breast: Secondary | ICD-10-CM

## 2024-01-13 ENCOUNTER — Ambulatory Visit
Admission: RE | Admit: 2024-01-13 | Discharge: 2024-01-13 | Disposition: A | Discharge status: Home / Self Care | Source: Ambulatory Visit | Attending: Family Medicine

## 2024-01-13 DIAGNOSIS — Z1231 Encounter for screening mammogram for malignant neoplasm of breast: Secondary | ICD-10-CM
